# Patient Record
Sex: Male | Born: 1960 | Hispanic: No | Marital: Married | Smoking: Never smoker
Health system: Southern US, Community
[De-identification: ages and names within clinical notes are randomized; demographics above are authoritative.]

## PROBLEM LIST (undated history)

## (undated) DIAGNOSIS — M1990 Unspecified osteoarthritis, unspecified site: Secondary | ICD-10-CM

## (undated) DIAGNOSIS — K429 Umbilical hernia without obstruction or gangrene: Secondary | ICD-10-CM

## (undated) HISTORY — DX: Unspecified osteoarthritis, unspecified site: M19.90

## (undated) HISTORY — PX: HERNIA REPAIR: SHX51

---

## 1998-06-15 ENCOUNTER — Emergency Department (HOSPITAL_COMMUNITY): Admission: EM | Admit: 1998-06-15 | Discharge: 1998-06-15 | Payer: Self-pay | Admitting: Emergency Medicine

## 1998-06-17 ENCOUNTER — Emergency Department (HOSPITAL_COMMUNITY): Admission: EM | Admit: 1998-06-17 | Discharge: 1998-06-17 | Payer: Self-pay | Admitting: Internal Medicine

## 1998-07-28 ENCOUNTER — Emergency Department (HOSPITAL_COMMUNITY): Admission: EM | Admit: 1998-07-28 | Discharge: 1998-07-28 | Payer: Self-pay | Admitting: Emergency Medicine

## 1999-09-03 ENCOUNTER — Emergency Department (HOSPITAL_COMMUNITY): Admission: EM | Admit: 1999-09-03 | Discharge: 1999-09-04 | Payer: Self-pay | Admitting: Emergency Medicine

## 1999-11-17 ENCOUNTER — Encounter: Payer: Self-pay | Admitting: Emergency Medicine

## 1999-11-17 ENCOUNTER — Emergency Department (HOSPITAL_COMMUNITY): Admission: EM | Admit: 1999-11-17 | Discharge: 1999-11-17 | Payer: Self-pay | Admitting: Emergency Medicine

## 2001-05-31 ENCOUNTER — Encounter: Payer: Self-pay | Admitting: Emergency Medicine

## 2001-05-31 ENCOUNTER — Emergency Department (HOSPITAL_COMMUNITY): Admission: EM | Admit: 2001-05-31 | Discharge: 2001-05-31 | Payer: Self-pay | Admitting: Emergency Medicine

## 2004-09-19 ENCOUNTER — Emergency Department (HOSPITAL_COMMUNITY): Admission: EM | Admit: 2004-09-19 | Discharge: 2004-09-19 | Payer: Self-pay | Admitting: Emergency Medicine

## 2010-12-22 ENCOUNTER — Emergency Department (HOSPITAL_COMMUNITY): Payer: Managed Care, Other (non HMO)

## 2010-12-22 ENCOUNTER — Emergency Department (HOSPITAL_COMMUNITY)
Admission: EM | Admit: 2010-12-22 | Discharge: 2010-12-22 | Disposition: A | Discharge status: Home / Self Care | Payer: Managed Care, Other (non HMO) | Attending: Emergency Medicine | Admitting: Emergency Medicine

## 2010-12-22 DIAGNOSIS — R51 Headache: Secondary | ICD-10-CM | POA: Insufficient documentation

## 2010-12-22 DIAGNOSIS — R42 Dizziness and giddiness: Secondary | ICD-10-CM | POA: Insufficient documentation

## 2010-12-22 DIAGNOSIS — J329 Chronic sinusitis, unspecified: Secondary | ICD-10-CM | POA: Insufficient documentation

## 2010-12-22 DIAGNOSIS — R112 Nausea with vomiting, unspecified: Secondary | ICD-10-CM | POA: Insufficient documentation

## 2011-08-09 ENCOUNTER — Ambulatory Visit: Payer: Managed Care, Other (non HMO) | Admitting: Family Medicine

## 2013-03-14 ENCOUNTER — Emergency Department (HOSPITAL_COMMUNITY)
Admission: EM | Admit: 2013-03-14 | Discharge: 2013-03-14 | Disposition: A | Discharge status: Home / Self Care | Payer: Managed Care, Other (non HMO) | Attending: Emergency Medicine | Admitting: Emergency Medicine

## 2013-03-14 ENCOUNTER — Encounter (HOSPITAL_COMMUNITY): Payer: Self-pay | Admitting: Emergency Medicine

## 2013-03-14 DIAGNOSIS — M5380 Other specified dorsopathies, site unspecified: Secondary | ICD-10-CM | POA: Insufficient documentation

## 2013-03-14 DIAGNOSIS — R109 Unspecified abdominal pain: Secondary | ICD-10-CM | POA: Insufficient documentation

## 2013-03-14 DIAGNOSIS — Z791 Long term (current) use of non-steroidal anti-inflammatories (NSAID): Secondary | ICD-10-CM | POA: Insufficient documentation

## 2013-03-14 DIAGNOSIS — M545 Low back pain, unspecified: Secondary | ICD-10-CM | POA: Insufficient documentation

## 2013-03-14 DIAGNOSIS — M549 Dorsalgia, unspecified: Secondary | ICD-10-CM

## 2013-03-14 LAB — URINALYSIS, ROUTINE W REFLEX MICROSCOPIC
Bilirubin Urine: NEGATIVE
Glucose, UA: NEGATIVE mg/dL
Hgb urine dipstick: NEGATIVE
Ketones, ur: NEGATIVE mg/dL
Leukocytes, UA: NEGATIVE
Nitrite: NEGATIVE
Protein, ur: NEGATIVE mg/dL
Specific Gravity, Urine: 1.026 (ref 1.005–1.030)
Urobilinogen, UA: 0.2 mg/dL (ref 0.0–1.0)
pH: 5.5 (ref 5.0–8.0)

## 2013-03-14 MED ORDER — METHOCARBAMOL 500 MG PO TABS: 1000.0000 mg | ORAL_TABLET | Freq: Two times a day (BID) | ORAL | Status: DC | PRN

## 2013-03-14 MED ORDER — HYDROCODONE-ACETAMINOPHEN 5-325 MG PO TABS: 2.0000 | ORAL_TABLET | ORAL | Status: DC | PRN

## 2013-03-14 NOTE — ED Provider Notes (Signed)
CSN: [id number]     Arrival date & time 03/14/13  1038 History  This chart was scribed for non-physician practitioner Arthor Captain, working with Ethelda Chick, MD by Nicholos Johns, ED scribe. This patient was seen in room WTR9/WTR9 and the patient's care was started at 12:27 PM.      Chief Complaint  Patient presents with  . Back Pain   Patient is a 52 y.o. male presenting with back pain. The history is provided by the patient. No language interpreter was used.  Back Pain Location:  Lumbar spine Pain severity:  Severe Onset quality:  Gradual Duration:  3 months Progression:  Worsening Chronicity:  Recurrent Relieved by:  Being still Worsened by:  Movement Ineffective treatments:  Ibuprofen Associated symptoms: no headaches    HPI Comments: Shaun Steele is a 52 y.o. male who presents to the Emergency Department complaining of severe Left flank back pain, onset 3 months ago. Went to urgent care and was given Ibuprofen which resolved it. Pain later returned. Pain worsened with movement, relief provided by laying flat. Pt rates pain a 9/10. Pt took 2 ibuprofen last night with no relief. Pt has slight pain with urination. Pt denies any injury relating to CC. Pt denies weakness in extremities, burning with urination, hematuria, difficulty ambulating, or rashes. Denies past UTI or kidney stones.   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   Pt also reports secondary cold symptoms including cough and sore throat, with associated HA due to coughing.   History reviewed. No pertinent past medical history. History reviewed. No pertinent past surgical history. No family history on file. History  Substance Use Topics  . Smoking status: Not on file  . Smokeless tobacco: Not on file  . Alcohol Use: Not on file    Review of Systems  Genitourinary: Positive for flank pain. Negative for hematuria and difficulty urinating.  Musculoskeletal: Positive for back pain.       No  difficulty ambulating  Skin: Negative for rash.  Neurological: Negative for headaches.  All other systems reviewed and are negative.    Allergies  Review of patient's allergies indicates no known allergies.  Home Medications   Current Outpatient Rx  Name  Route  Sig  Dispense  Refill  . DM-Doxylamine-Acetaminophen (NYQUIL COLD & FLU PO)   Oral   Take 1 tablet by mouth at bedtime as needed (cold).         Marland Kitchen ibuprofen (ADVIL,MOTRIN) 600 MG tablet   Oral   Take 1 tablet by mouth daily.          Triage Vitals: BP 134/82  Pulse 69  Temp(Src) 98.1 F (36.7 C) (Oral)  Resp 16  SpO2 99% Physical Exam  Nursing note and vitals reviewed. Constitutional: He is oriented to person, place, and time. He appears well-developed and well-nourished. No distress.  HENT:  Head: Normocephalic and atraumatic.  Eyes: EOM are normal.  Neck: Neck supple.  Cardiovascular: Normal rate.   Pulmonary/Chest: Effort normal. No respiratory distress.  Musculoskeletal: Normal range of motion.  Spasm in mid thoracic's and paraspinal muscles. No cva tenderness. Full ROM. Pain worsened with twisting to the left.  Neurological: He is alert and oriented to person, place, and time.  Skin: Skin is warm and dry.  Psychiatric: He has a normal mood and affect. His behavior is normal.    ED Course  Procedures (including critical care time) DIAGNOSTIC STUDIES: Oxygen Saturation is 99% on room air, normal by  my interpretation.    COORDINATION OF CARE: At 12:31 PM:  Discussed treatment plan with patient which includes UA. Patient agrees.   Labs Review Labs Reviewed  URINALYSIS, ROUTINE W REFLEX MICROSCOPIC   Imaging Review No results found.  EKG Interpretation   None       MDM   1. Back pain    Patient with back pain.  No neurological deficits and normal neuro exam.  Patient can walk but states is painful.  No loss of bowel or bladder control.  No concern for cauda equina.  No fever, night  sweats, weight loss, h/o cancer, IVDU.  RICE protocol and pain medicine indicated and discussed with patient.  I personally performed the services described in this documentation, which was scribed in my presence. The recorded information has been reviewed and is accurate.      Arthor Captain, PA-C 03/14/13 2239

## 2013-03-14 NOTE — ED Notes (Signed)
Patient has had back pain x 3 month on and off since yesterday pain has increased. Not relieved by Ibuprofen. Patient denies recent injury.

## 2013-03-17 NOTE — ED Provider Notes (Signed)
Medical screening examination/treatment/procedure(s) were performed by non-physician practitioner and as supervising physician I was immediately available for consultation/collaboration.  EKG Interpretation   None        Arieon Corcoran K Linker, MD 03/17/13 0709 

## 2013-03-18 ENCOUNTER — Emergency Department (HOSPITAL_COMMUNITY): Payer: Managed Care, Other (non HMO)

## 2013-03-18 ENCOUNTER — Encounter (HOSPITAL_COMMUNITY): Payer: Self-pay | Admitting: Emergency Medicine

## 2013-03-18 ENCOUNTER — Emergency Department (HOSPITAL_COMMUNITY)
Admission: EM | Admit: 2013-03-18 | Discharge: 2013-03-18 | Disposition: A | Discharge status: Home / Self Care | Payer: Managed Care, Other (non HMO) | Attending: Emergency Medicine | Admitting: Emergency Medicine

## 2013-03-18 DIAGNOSIS — M545 Low back pain, unspecified: Secondary | ICD-10-CM | POA: Insufficient documentation

## 2013-03-18 DIAGNOSIS — Z791 Long term (current) use of non-steroidal anti-inflammatories (NSAID): Secondary | ICD-10-CM | POA: Insufficient documentation

## 2013-03-18 MED ORDER — DIAZEPAM 5 MG PO TABS: ORAL_TABLET | ORAL | Status: DC

## 2013-03-18 MED ORDER — OXYCODONE-ACETAMINOPHEN 5-325 MG PO TABS: 1.0000 | ORAL_TABLET | Freq: Four times a day (QID) | ORAL | Status: DC | PRN

## 2013-03-18 MED ORDER — DIAZEPAM 5 MG PO TABS
5.0000 mg | ORAL_TABLET | Freq: Once | ORAL | Status: AC
  Administered 2013-03-18: 5 mg via ORAL
  Filled 2013-03-18: qty 1

## 2013-03-18 MED ORDER — HYDROMORPHONE HCL PF 2 MG/ML IJ SOLN
2.0000 mg | Freq: Once | INTRAMUSCULAR | Status: AC
  Administered 2013-03-18: 2 mg via INTRAMUSCULAR
  Filled 2013-03-18: qty 1

## 2013-03-18 NOTE — ED Provider Notes (Signed)
CSN: [id number]     Arrival date & time 03/18/13  1145 History   First MD Initiated Contact with Patient 03/18/13 1211    This chart was scribed for Shaun Steele, a non-physician practitioner working with Ethelda Chick, MD by Lewanda Rife, ED Scribe. This patient was seen in room WTR8/WTR8 and the patient's care was started at 12:53 PM     Chief Complaint  Patient presents with  . lowere back pain    (Consider location/radiation/quality/duration/timing/severity/associated sxs/prior Treatment) The history is provided by the patient and medical records. No language interpreter was used.   HPI Comments: Shaun Steele is a 52 y.o. male who presents to the Emergency Department complaining of constant gradually worsening low back pain onset 7 days. Pt was evaluated for the same 4 days ago. Describes pain as sharp and focal. Reports prescribed hydrocodone does not alleviate symptoms. Reports symptoms are aggravated with movement and supine position. Denies associated recent injury, fall, and change in activity. Denies urinary or fecal incontinence, perineal/saddle paresthesias, fever, and PMHx of cancer. No abdominal pain, hematuria.    History reviewed. No pertinent past medical history. History reviewed. No pertinent past surgical history. No family history on file. History  Substance Use Topics  . Smoking status: Never Smoker   . Smokeless tobacco: Not on file  . Alcohol Use: No    Review of Systems  Constitutional: Negative for fever, activity change and unexpected weight change.  Gastrointestinal: Negative for abdominal pain, constipation and blood in stool.       Neg for fecal incontinence  Genitourinary: Negative for hematuria, flank pain and difficulty urinating.       Negative for urinary incontinence or retention  Musculoskeletal: Positive for back pain.  Neurological: Negative for weakness and numbness.       Negative for saddle paresthesias    Psychiatric/Behavioral: Negative for confusion.    Allergies  Review of patient's allergies indicates no known allergies.  Home Medications   Current Outpatient Rx  Name  Route  Sig  Dispense  Refill  . DM-Doxylamine-Acetaminophen (NYQUIL COLD & FLU PO)   Oral   Take 1 tablet by mouth at bedtime as needed (cold).         Marland Kitchen HYDROcodone-acetaminophen (NORCO/VICODIN) 5-325 MG per tablet   Oral   Take 2 tablets by mouth every 4 (four) hours as needed for moderate pain or severe pain.         Marland Kitchen ibuprofen (ADVIL,MOTRIN) 600 MG tablet   Oral   Take 1 tablet by mouth daily.         . methocarbamol (ROBAXIN) 500 MG tablet   Oral   Take 1,000 mg by mouth 2 (two) times daily as needed for muscle spasms.          BP 132/90  Pulse 67  Temp(Src) 97.9 F (36.6 C) (Oral)  Resp 18  SpO2 100% Physical Exam  Nursing note and vitals reviewed. Constitutional: He appears well-developed and well-nourished. No distress.  HENT:  Head: Normocephalic and atraumatic.  Eyes: Conjunctivae and EOM are normal.  Neck: Normal range of motion. Neck supple. No tracheal deviation present.  Cardiovascular: Normal rate and regular rhythm.   No murmur heard. Pulmonary/Chest: Effort normal and breath sounds normal. No respiratory distress. He has no wheezes.  Abdominal: Soft. There is no tenderness. There is no rebound, no guarding and no CVA tenderness.  No abdominal pulsatile mass.   Musculoskeletal: Normal range of motion. He exhibits no tenderness.  Back:  No midline C-spine, T-spine, or L-spine tenderness with no step-offs or deformities noted    Neurological: He is alert. He has normal reflexes. No sensory deficit. He exhibits normal muscle tone.  5/5 strength in entire lower extremities bilaterally. No sensation deficit.   Skin: Skin is warm and dry.  Psychiatric: He has a normal mood and affect. His behavior is normal.    ED Course  Procedures   COORDINATION OF  CARE:  Nursing notes reviewed. Vital signs reviewed. Initial pt interview and examination performed.   12:57 PM-Discussed work up and treatment plan with pt at bedside, which includes x-ray of lumbar spine. Pt agrees with plan.   Treatment plan initiated: Medications  HYDROmorphone (DILAUDID) injection 2 mg (2 mg Intramuscular Given 03/18/13 1313)  diazepam (VALIUM) tablet 5 mg (5 mg Oral Given 03/18/13 1312)    1:53 PM Nursing Notes Reviewed/ Care Coordinated Applicable Imaging Reviewed and incorporated into ED treatment Discussed results and treatment plan with pt. Pt demonstrates understanding and agrees with plan. Pt states symptoms have improved significantly with pain medications administered in ED.   Initial diagnostic testing ordered.    Labs Review Labs Reviewed - No data to display Imaging Review Dg Lumbar Spine Complete  03/18/2013   CLINICAL DATA:  Sharp left lower back pain for 1 week. No known injury.  EXAM: LUMBAR SPINE - COMPLETE 4+ VIEW  COMPARISON:  None.  FINDINGS: No fracture or spondylolisthesis. Mild loss of disc height at L4-L5 and at T 12-L1. Small anterior endplate osteophytes are noted at these levels most prominently at T12-L1.  The remaining lumbar disc spaces are well preserved. The soft tissues are unremarkable.  IMPRESSION: No fracture or spondylolisthesis. Mild degenerative changes as detailed.   Electronically Signed   By: Amie Portland M.D.   On: 03/18/2013 13:25    EKG Interpretation   None      Patient seen and examined. Work-up initiated. Medications ordered.   Vital signs reviewed and are as follows: Filed Vitals:   03/18/13 1156  BP: 132/90  Pulse: 67  Temp: 97.9 F (36.6 C)  Resp: 18    Pt informed of x-ray results. X-ray suggests arthritis in lower T-spine, upper L-spine.   No red flag s/s of low back pain. Patient was counseled on back pain precautions and told to do activity as tolerated but do not lift, push, or pull heavy  objects more than 10 pounds for the next week.  Patient counseled to use ice or heat on back for no longer than 15 minutes every hour.   Patient prescribed muscle relaxer and counseled on proper use of muscle relaxant medication. Told not to combine valium with previously prescribed robaxin.     Patient prescribed narcotic pain medicine and counseled on proper use of narcotic pain medications. Counseled not to combine this medication with others containing tylenol. Told not to combine Percocet with previously prescribed Vicodin.   Urged patient not to drink alcohol, drive, or perform any other activities that requires focus while taking either of these medications.  Patient urged to follow-up with PCP if pain does not improve with treatment and rest or if pain becomes recurrent. Urged to return with worsening severe pain, loss of bowel or bladder control, trouble walking.   The patient verbalizes understanding and agrees with the plan.    MDM   1. Lower back pain    Patient with back pain that is reproducible with palpation and movement. X-ray ordered as pain  is persistent and not improved with previous treatment. X-ray suggests arthritis, and it is possible patient's pain is 2/2 nerve compression. Do not suspect AAA. No neurological deficits. Patient is ambulatory. No warning symptoms of back pain including: loss of bowel or bladder control, night sweats, waking from sleep with back pain, unexplained fevers or weight loss, h/o cancer, IVDU, recent trauma. No concern for cauda equina, epidural abscess, or other serious cause of back pain. Conservative measures such as rest, ice/heat and pain medicine indicated with PCP follow-up if no improvement with conservative management.   I personally performed the services described in this documentation, which was scribed in my presence. The recorded information has been reviewed and is accurate.    Renne Crigler, PA-C 03/18/13 1416

## 2013-03-18 NOTE — ED Provider Notes (Signed)
Medical screening examination/treatment/procedure(s) were performed by non-physician practitioner and as supervising physician I was immediately available for consultation/collaboration.  EKG Interpretation   None        Ethelda Chick, MD 03/18/13 1424

## 2013-03-18 NOTE — ED Notes (Addendum)
Per pt, states left lower back pain for over a week, was here for same syptoms on 24 th-was told it was muscle related-not getting better-no dysuria

## 2013-04-05 ENCOUNTER — Ambulatory Visit: Payer: Managed Care, Other (non HMO) | Admitting: Family Medicine

## 2013-04-17 ENCOUNTER — Encounter (INDEPENDENT_AMBULATORY_CARE_PROVIDER_SITE_OTHER): Payer: Self-pay | Admitting: Surgery

## 2013-04-17 ENCOUNTER — Telehealth (INDEPENDENT_AMBULATORY_CARE_PROVIDER_SITE_OTHER): Payer: Self-pay | Admitting: Surgery

## 2013-04-17 ENCOUNTER — Ambulatory Visit (INDEPENDENT_AMBULATORY_CARE_PROVIDER_SITE_OTHER): Payer: BC Managed Care – PPO | Admitting: Surgery

## 2013-04-17 VITALS — BP 130/78 | HR 76 | Temp 98.0°F | Resp 18 | Ht 75.0 in | Wt 230.0 lb

## 2013-04-17 DIAGNOSIS — K4090 Unilateral inguinal hernia, without obstruction or gangrene, not specified as recurrent: Secondary | ICD-10-CM | POA: Insufficient documentation

## 2013-04-17 NOTE — Progress Notes (Signed)
Re:   Shaun OvensMostafa Pruett DOB:   1960/10/28 MRN:   [id number]  ASSESSMENT AND PLAN: 1.  Left inguinal hernia  I discussed the indications and complications of hernia surgery with the patient.  I discussed both the laparoscopic and open approach to hernia repair..  The potential risks of hernia surgery include, but are not limited to, bleeding, infection, open surgery, nerve injury, and recurrence of the hernia.  I provided the patient literature about hernia surgery.  I talked about being out of work for 4 weeks, unless he could do light duty.  Then his return would be about 1 to 2 weeks.  He wants to do the laparoscopic inguinal hernia repair.    2.  Left back pain  Seen in ER on 03/18/2013 3.  Asymptomatic umbilical hernia  Chief Complaint  Patient presents with  . New Evaluation    L/H   REFERRING PHYSICIAN: Lolita PatellaEADE,ROBERT ALEXANDER, MD  ([facility] [place])  HISTORY OF PRESENT ILLNESS: Shaun Steele is a 53 y.o. (DOB: 1960/10/28)  [place]  male whose primary care physician is Lolita PatellaEADE,ROBERT ALEXANDER, MD and comes to me today for left inguinal hernia.  He was lifting an airplane wheel (with two other people) Friday a week ago, 04/06/2013, at work when he felt some pain in his left groin.  [facility], where he saw Dr. Leeroy Bock. Reade who made the diagnosis of a left inguinal hernia.   The patient has had no prior inguinal or abdominal hernia.  He denies stomach disease, liver disease, or gall bladder disease.  No history of pancreas disease.  No history of colon disease. He has not had a colonoscopy.   No past medical history on file.   No past surgical history on file.    Current Outpatient Prescriptions  Medication Sig Dispense Refill  . ibuprofen (ADVIL,MOTRIN) 600 MG tablet Take 1 tablet by mouth daily.      . diazepam (VALIUM) 5 MG tablet Take 1-2 tablets every 8 hours as needed for muscle spasm  12 tablet  0  .  DM-Doxylamine-Acetaminophen (NYQUIL COLD & FLU PO) Take 1 tablet by mouth at bedtime as needed (cold).      Marland Kitchen. HYDROcodone-acetaminophen (NORCO/VICODIN) 5-325 MG per tablet Take 2 tablets by mouth every 4 (four) hours as needed for moderate pain or severe pain.      . methocarbamol (ROBAXIN) 500 MG tablet Take 1,000 mg by mouth 2 (two) times daily as needed for muscle spasms.      Marland Kitchen. oxyCODONE-acetaminophen (PERCOCET/ROXICET) 5-325 MG per tablet Take 1-2 tablets by mouth every 6 (six) hours as needed for severe pain.  15 tablet  0   No current facility-administered medications for this visit.     No Known Allergies  REVIEW OF SYSTEMS: Skin:  No history of rash.  No history of abnormal moles. Infection:  No history of hepatitis or HIV.  No history of MRSA. Neurologic:  No history of stroke.  No history of seizure.  No history of headaches. Cardiac:  No history of hypertension. No history of heart disease.   Pulmonary:  Does not smoke cigarettes.  No asthma or bronchitis.  No OSA/CPAP.  Endocrine:  No diabetes. No thyroid disease. Gastrointestinal:  See HPI. Urologic:  No history of kidney stones.  No history of bladder infections. Musculoskeletal:  Left back pain. Seen in ER on 03/18/2013 Hematologic:  No bleeding disorder.  No history of  anemia.  Not anticoagulated. Psycho-social:  The patient is oriented.   The patient has no obvious psychologic or social impairment to understanding our conversation and plan.  SOCIAL and FAMILY HISTORY: Married.   Has 3 children: 9, 10, and 11. He works at [employer] in [employer].  PHYSICAL EXAM: BP 130/78  Pulse 76  Temp(Src) 98 F (36.7 C)  Resp 18  Ht 6\' 3"  (1.905 m)  Wt 230 lb (104.327 kg)  BMI 28.75 kg/m2  General: WN middle [place] male who is alert and generally healthy appearing.  HEENT: Normal. Pupils equal. Neck: Supple. No mass.  No thyroid mass. Lymph Nodes:  No supraclavicular or cervical nodes. Lungs: Clear to auscultation and  symmetric breath sounds. Heart:  RRR. No murmur or rub. Abdomen: Soft. No mass. No tenderness.  Normal bowel sounds.  No abdominal scars.  Has asymptomatic umbilical hernia.  Has medium sized left inguinal hernia - which is almost reducible.  I did not completely reduce. Rectal: Not done. Extremities:  Good strength and ROM  in upper and lower extremities. Neurologic:  Grossly intact to motor and sensory function. Psychiatric: Has normal mood and affect. Behavior is normal.   DATA REVIEWED: Notes from [facility]  Ovidio Kin, MD,  [facility], [place] [address] [place]  [address]   [place], [place]    [address] Phone:  [phone] FAX:  [phone]

## 2013-04-17 NOTE — Telephone Encounter (Signed)
Patient met with surgery scheduling gave financial responsibilites, patient will call back to schedule

## 2013-04-18 ENCOUNTER — Encounter (INDEPENDENT_AMBULATORY_CARE_PROVIDER_SITE_OTHER): Payer: Self-pay

## 2013-04-18 ENCOUNTER — Telehealth (INDEPENDENT_AMBULATORY_CARE_PROVIDER_SITE_OTHER): Payer: Self-pay | Admitting: General Surgery

## 2013-04-18 NOTE — Telephone Encounter (Signed)
Office visit not faxed to employer attention: Peggy # [phone]

## 2013-04-18 NOTE — Telephone Encounter (Signed)
Pt called to ask for letter to be sent to his employer, excusing him from work yesterday (appt with Dr. Ezzard StandingNewman.)   Send letter to [employer], attention:  Peggy [facility] [phone].

## 2013-05-21 ENCOUNTER — Emergency Department (HOSPITAL_COMMUNITY)
Admission: EM | Admit: 2013-05-21 | Discharge: 2013-05-22 | Disposition: A | Discharge status: Home / Self Care | Payer: BC Managed Care – PPO | Attending: Emergency Medicine | Admitting: Emergency Medicine

## 2013-05-21 ENCOUNTER — Encounter (HOSPITAL_COMMUNITY): Payer: Self-pay | Admitting: Emergency Medicine

## 2013-05-21 DIAGNOSIS — K429 Umbilical hernia without obstruction or gangrene: Secondary | ICD-10-CM | POA: Insufficient documentation

## 2013-05-21 DIAGNOSIS — R1032 Left lower quadrant pain: Secondary | ICD-10-CM

## 2013-05-21 LAB — CBC WITH DIFFERENTIAL/PLATELET
BASOS PCT: 0 % (ref 0–1)
Basophils Absolute: 0 10*3/uL (ref 0.0–0.1)
EOS ABS: 0.1 10*3/uL (ref 0.0–0.7)
Eosinophils Relative: 2 % (ref 0–5)
HCT: 39.4 % (ref 39.0–52.0)
Hemoglobin: 13.5 g/dL (ref 13.0–17.0)
Lymphocytes Relative: 44 % (ref 12–46)
Lymphs Abs: 2.6 10*3/uL (ref 0.7–4.0)
MCH: 29.8 pg (ref 26.0–34.0)
MCHC: 34.3 g/dL (ref 30.0–36.0)
MCV: 87 fL (ref 78.0–100.0)
Monocytes Absolute: 0.6 10*3/uL (ref 0.1–1.0)
Monocytes Relative: 10 % (ref 3–12)
NEUTROS PCT: 44 % (ref 43–77)
Neutro Abs: 2.6 10*3/uL (ref 1.7–7.7)
Platelets: 259 10*3/uL (ref 150–400)
RBC: 4.53 MIL/uL (ref 4.22–5.81)
RDW: 12.9 % (ref 11.5–15.5)
WBC: 5.9 10*3/uL (ref 4.0–10.5)

## 2013-05-21 NOTE — ED Notes (Signed)
Per pt report: pt c/o of abd pain the LLQ.  Pt has already been evaluated for the pain and was dx with a hernia.  Pt has surgery on 3/19 but pt does not have money for surgery.  Pt reports return of pain both last night and tonight around 21:00.  Pt a/o x 4.  Skin warm and dry. NAD noted.

## 2013-05-22 DIAGNOSIS — R1032 Left lower quadrant pain: Secondary | ICD-10-CM | POA: Diagnosis present

## 2013-05-22 LAB — URINALYSIS, ROUTINE W REFLEX MICROSCOPIC
BILIRUBIN URINE: NEGATIVE
Glucose, UA: NEGATIVE mg/dL
Ketones, ur: NEGATIVE mg/dL
Leukocytes, UA: NEGATIVE
NITRITE: NEGATIVE
PH: 6 (ref 5.0–8.0)
Protein, ur: NEGATIVE mg/dL
SPECIFIC GRAVITY, URINE: 1.017 (ref 1.005–1.030)
Urobilinogen, UA: 0.2 mg/dL (ref 0.0–1.0)

## 2013-05-22 LAB — COMPREHENSIVE METABOLIC PANEL
ALBUMIN: 4 g/dL (ref 3.5–5.2)
ALT: 15 U/L (ref 0–53)
AST: 16 U/L (ref 0–37)
Alkaline Phosphatase: 72 U/L (ref 39–117)
BUN: 15 mg/dL (ref 6–23)
CO2: 29 mEq/L (ref 19–32)
Calcium: 10 mg/dL (ref 8.4–10.5)
Chloride: 99 mEq/L (ref 96–112)
Creatinine, Ser: 0.86 mg/dL (ref 0.50–1.35)
GFR calc Af Amer: >90 mL/min (ref >90)
GFR calc non Af Amer: >90 mL/min (ref >90)
Glucose, Bld: 99 mg/dL (ref 70–99)
POTASSIUM: 4.5 meq/L (ref 3.7–5.3)
Sodium: 139 mEq/L (ref 137–147)
Total Bilirubin: 0.3 mg/dL (ref 0.3–1.2)
Total Protein: 7.6 g/dL (ref 6.0–8.3)

## 2013-05-22 LAB — LIPASE, BLOOD: LIPASE: 43 U/L (ref 11–59)

## 2013-05-22 LAB — URINE MICROSCOPIC-ADD ON

## 2013-05-22 MED ORDER — OXYCODONE-ACETAMINOPHEN 5-325 MG PO TABS: 1.0000 | ORAL_TABLET | Freq: Four times a day (QID) | ORAL | Status: DC | PRN

## 2013-05-22 NOTE — Discharge Instructions (Signed)
°Emergency Department Resource Guide °1) Find a Doctor and Pay Out of Pocket °Although you won't have to find out who is covered by your insurance plan, it is a good idea to ask around and get recommendations. You will then need to call the office and see if the doctor you have chosen will accept you as a new patient and what types of options they offer for patients who are self-pay. Some doctors offer discounts or will set up payment plans for their patients who do not have insurance, but you will need to ask so you aren't surprised when you get to your appointment. ° °2) Contact Your Local Health Department °Not all health departments have doctors that can see patients for sick visits, but many do, so it is worth a call to see if yours does. If you don't know where your local health department is, you can check in your phone book. The CDC also has a tool to help you locate your state's health department, and many state websites also have listings of all of their local health departments. ° °3) Find a Walk-in Clinic °If your illness is not likely to be very severe or complicated, you may want to try a walk in clinic. These are popping up all over the country in pharmacies, drugstores, and shopping centers. They're usually staffed by nurse practitioners or physician assistants that have been trained to treat common illnesses and complaints. They're usually fairly quick and inexpensive. However, if you have serious medical issues or chronic medical problems, these are probably not your best option. ° °No Primary Care Doctor: °- Call [facility] at  832-8000 - they can help you locate a primary care doctor that  accepts your insurance, provides certain services, etc. °- Physician Referral Service- 1-800-533-3463 ° °Chronic Pain Problems: °Organization         Address  Phone   Notes  °[facility]  (336) 297-2271 Patients need to be referred by their primary care doctor.  ° °Medication  Assistance: °Organization         Address  Phone   Notes  °[place] [facility] [address] °[place], [place] [address] (336) 641-8030 --Must be a resident of [place] °-- Must have NO insurance coverage whatsoever (no Medicaid/ Medicare, etc.) °-- The pt. MUST have a primary care doctor that directs their care regularly and follows them in the community °  °[facility]  (866) 331-1348   °[employer]  (888) 892-1162   ° °Agencies that provide inexpensive medical care: °Organization         Address  Phone   Notes  °[facility]  (336) 832-8035   °[facility]    (336) 832-7272   °[facility] [address] °[place], [place] [address] (336) 832-4777   °[facility] [address], °[place] (336) 271-4999   °[facility]    (336) 373-0678   °[facility]    (336) 272-1050   °[facility] ° [address], [place] Phone:  (336) 832-4444, Fax:  (336) 832-4440 Hours of Operation:  9 am - 6 pm, M-F.  Also accepts Medicaid/Medicare and self-pay.  °[facility] ° [address], [place] Phone: (336) 832-3150, Fax: (336) 832-3151. Hours of Operation:  8:30 am - 5:30 pm, M-F.  Also accepts Medicaid and self-pay.  °[facility] [place] [address], [place] Phone: (336) 878-6027   °[facility] [address], [place], [place] (336)723-1848, Ext. 123 Mondays & Thursdays: 7-9 AM.  First 15 patients are seen on a first come, first serve basis. °  ° °Medicaid-accepting [place] Providers: ° °Organization         Address  Phone   Notes  °[facility] [address], [place] (336) 641-2100 Also accepts self-pay patients.  °[facility] [address], [place] ° (336) 856-9996   °[facility] [address], [place]  (336) 288-8857   °[facility] [address] [place] [address], [place] (336) 299-7000   °Veita Bland [address], [place]  ° (336) 373-1557 Only accepts [place] Access Medicaid patients after they have their name applied to their card.  ° °Self-Pay (no insurance) in [place]: ° °Organization         Address  Phone   Notes  °Sickle Cell Patients, [facility] [address], [place] (336) 832-1970   °[facility] [address], [place] (336) 832-4400   °[facility] ° [address], [place] (336) 992-4800   °[facility]/Dr. Osei-Bonsu ° [address] [place] [address], [place] or [address], [place] (336) 841-8500 Phone number for both [place] and [place] locations is the same.  °[facility] [address], [place] (336) 299-0000   °[facility] [address] [place] [address], [place] or [address] (336) 852-7530 °(336) 878-2260   °[facility] [address], [place] (336) 350-1642, phone; (336) 294-5005, fax Sees patients 1st and 3rd Saturday of every month.  Must not qualify for public or private insurance (i.e. Medicaid, Medicare, [place] Health Choice, Veterans' Benefits) • Household income should be no more than 200% of the poverty level •The clinic cannot treat you if you are pregnant or think you are pregnant • Sexually transmitted diseases are not treated at the clinic.  ° ° °Dental Care: °Organization         Address  Phone  Notes  °[place] [facility] [address], [place] (336) 641-6152 Accepts children up to age 21 who are enrolled in Medicaid or [place] Health Choice; pregnant women with a Medicaid card; and children who have applied for Medicaid or [place] Health Choice, but were declined, whose parents can pay a reduced fee at time of service.  °[place]  [facility] [place]  [address], [place] (336) 641-7733 Accepts children up to age 21 who are enrolled in Medicaid or  Health Choice; pregnant women with a Medicaid card; and children who have applied for Medicaid or  Health Choice, but were declined, whose parents can pay a reduced fee at time of service.  °[facility] ° [address],  (336) 641-4533 Patients are seen by appointment only. Walk-ins are not accepted. [facility] will see patients 18 years of age and older. °Monday - Tuesday (8am-5pm) °Most Wednesdays (8:30-5pm) °$30 per visit, cash only  °[facility] ° [address], [place] (336) 641-4533 Patients are seen by appointment only. Walk-ins are not accepted. [facility] will see patients 18 years of age and older. °One   Wednesday Evening (Monthly: Volunteer Based).  $30 per visit, cash only  °[facility]  (919) 537-3737 for adults; Children under age 4, call Graduate Pediatric Dentistry at ([phone]) [phone]. Children aged 4-14, please call (919) [phone] to request a pediatric application. ° Dental services are provided in all areas of dental care including fillings, crowns and bridges, complete and partial dentures, implants, gum treatment, root canals, and extractions. Preventive care is also provided. Treatment is provided to both adults and children. °Patients are selected via a lottery and there is often a waiting list. °  °[facility] [address], °[place] ° (336) 763-8833 [email or web address] °  °[facility] [address], [place], [place] (336)723-1848, Ext. 123 Second and Fourth Thursday of each month, opens at 6:30 AM; Clinic ends at 9 AM.  Patients are seen on a first-come first-served basis, and a limited number are seen during each clinic.  ° °[facility] ° [address], [place], [place] (336) 723-7904    Eligibility Requirements °You must have lived in [place], [place], or [place] counties for at least the last three months. °  You cannot be eligible for state or federal sponsored healthcare insurance, including Veterans Administration, Medicaid, or Medicare. °  You generally cannot be eligible for healthcare insurance through your employer.  °  How to apply: °Eligibility screenings are held every Tuesday and Wednesday afternoon from 1:00 pm until 4:00 pm. You do not need an appointment for the interview!  °[facility] [address], [place], [place] 336-631-2330   °[place] [facility]  336-342-8273   °[facility]  336-703-3100   °[facility]  336-570-6415   ° °Behavioral Health Resources in the Community: °Intensive Outpatient Programs °Organization         Address  Phone  Notes  °[facility] [address], [place], [place] 336-878-6098   °[facility] [address], [place], [place] 336-832-9800   °[facility] [address], [place], [place] ° 336-882-2125   °[facility] [address],  °[place], [place] 1-800-853-5163 or 336-641-4981   °Substance Abuse Resources °Organization         Address  Phone  Notes  °[facility]  336-882-2125   °[facility]  336-784-9470   °[facility]  336-285-9073   °[facility]  336-845-3988   °Residential & Outpatient Substance Abuse Program  1-800-659-3381   °Psychological Services °Organization         Address  Phone  Notes  °[facility]  336- 832-9600   °[facility]  336- 378-7881   °[facility] [address], [place] 1-800-853-5163 or 336-641-4981   ° °Mobile Crisis Teams °Organization         Address  Phone  Notes  °[facility], Mobile Crisis Care Unit  1-877-626-1772   °Assertive °[facility] ° [address]  [place], [place] 336-834-9664   °Sharon DeEsch [address] °[place] 336-554-5454   ° °Self-Help/Support Groups °Organization         Address  Phone             Notes  °[facility] - variety of support groups  336- 373-1402 Call for more information  °Narcotics Anonymous (NA), [facility] [address], °[place]  2 meetings at this location  ° °  Residential Treatment Programs °Organization         Address  Phone  Notes  °[facility] [address],    °[place]  1-866-801-8205   °[facility] ° [address], [place], [place] 704-293-8524   °[facility] [address], [place] 336-845-3988 Admissions: 8am-3pm M-F  °[facility] [address]    °[place], [place] 336-841-1104   °[facility] [address], [place], [place] 336-379-7146   °[facility] [address]  °[place], [place] 336-285-9073   °[facility] - Intensive Outpatient [address], [place], [place] 336-852-3033   °[facility] [address]  °[place], [place] 1-877-615-2722 or 336-784-9470   °[facility] [address] [place], [place] 336-227-7417 Accepts Medicaid  °[facility] [address]  ° [place] 1-800-659-3381 Substance Abuse/Addiction Treatment  ° °[place] Behavioral Health Resources °Organization         Address  Phone  Notes  °[facility]  (888) 581-9988   °Julie Brannon, PhD [address] [place], [place]   (336) 349-5553 or (336) 951-0000   °[facility]   [address] °[place], [place] (336) 349-4454   °[facility] [address], [place], [place] (336) 342-8316 Insurance/Medicaid/sponsorship through [facility]  °[facility] [address]                                    [place], [place] (336) 342-8316 Therapy/tele-psych/case    °[facility] [address]  ° [place], [place] (336) 349-2233    °Dr. Arfeen  (336) 349-4544   °[facility] [place]  [employer] [place] [facility] 1) [address], [place] °2) [address], [place] °3)  [address], [place] (336) 349-3220 °(336) 342-7768 ° °(336) 342-8140   °[place] [facility] (336) 342-1394 or (336) 342-3537 (After Hours)    ° ° °

## 2013-05-22 NOTE — ED Notes (Signed)
Patient states he is having L groin pain. Patient states he has been seen by a surgeon and was advised he needs surgery. Patient states he is unable to afford the upfront fee required to schedule the operation.

## 2013-05-22 NOTE — ED Provider Notes (Signed)
CSN: [id number]     Arrival date & time 05/21/13  2233 History   First MD Initiated Contact with Patient 05/22/13 [id number]     Chief Complaint  Patient presents with  . Groin Pain    hx of hernia that needs surgical removal, patient states he can not afford the surgery     (Consider location/radiation/quality/duration/timing/severity/associated sxs/prior Treatment) Patient is a 53 y.o. male presenting with groin pain. The history is provided by the patient.  Groin Pain This is a recurrent problem. The current episode started 3 to 5 hours ago. Episode frequency: intermittent. The problem has been resolved. Pertinent negatives include no chest pain, no abdominal pain, no headaches and no shortness of breath. Exacerbated by: valsalva. Relieved by: spontaneously. He has tried nothing for the symptoms. The treatment provided significant relief.    History reviewed. No pertinent past medical history. History reviewed. No pertinent past surgical history. No family history on file. History  Substance Use Topics  . Smoking status: Never Smoker   . Smokeless tobacco: Not on file  . Alcohol Use: No    Review of Systems  Constitutional: Negative for fever.  HENT: Negative for drooling and rhinorrhea.   Eyes: Negative for pain.  Respiratory: Negative for cough and shortness of breath.   Cardiovascular: Negative for chest pain and leg swelling.  Gastrointestinal: Negative for nausea, vomiting, abdominal pain and diarrhea.  Genitourinary: Negative for dysuria and hematuria.  Musculoskeletal: Negative for gait problem and neck pain.  Skin: Negative for color change.  Neurological: Negative for numbness and headaches.  Hematological: Negative for adenopathy.  Psychiatric/Behavioral: Negative for behavioral problems.  All other systems reviewed and are negative.      Allergies  Review of patient's allergies indicates no known allergies.  Home Medications   Current Outpatient Rx  Name   Route  Sig  Dispense  Refill  . oxyCODONE-acetaminophen (PERCOCET) 5-325 MG per tablet   Oral   Take 1 tablet by mouth every 6 (six) hours as needed for moderate pain.   15 tablet   0    BP 117/80  Pulse 68  Temp(Src) 97.8 F (36.6 C) (Oral)  Resp 14  Ht 6\' 2"  (1.88 m)  Wt 220 lb (99.791 kg)  BMI 28.23 kg/m2  SpO2 97% Physical Exam  Nursing note and vitals reviewed. Constitutional: He is oriented to person, place, and time. He appears well-developed and well-nourished.  HENT:  Head: Normocephalic and atraumatic.  Right Ear: External ear normal.  Left Ear: External ear normal.  Nose: Nose normal.  Mouth/Throat: Oropharynx is clear and moist. No oropharyngeal exudate.  Eyes: Conjunctivae and EOM are normal. Pupils are equal, round, and reactive to light.  Neck: Normal range of motion. Neck supple.  Cardiovascular: Normal rate, regular rhythm, normal heart sounds and intact distal pulses.  Exam reveals no gallop and no friction rub.   No murmur heard. Pulmonary/Chest: Effort normal and breath sounds normal. No respiratory distress. He has no wheezes.  Abdominal: Soft. Bowel sounds are normal. He exhibits no distension. There is no tenderness. There is no rebound and no guarding.  Small umbilical hernia.   Genitourinary:  Soft left sided inguinal hernia which is not ttp. Not reducible by me.   Musculoskeletal: Normal range of motion. He exhibits no edema and no tenderness.  Neurological: He is alert and oriented to person, place, and time.  Skin: Skin is warm and dry.  Psychiatric: He has a normal mood and affect. His behavior is normal.  ED Course  Procedures (including critical care time) Labs Review Labs Reviewed  URINALYSIS, ROUTINE W REFLEX MICROSCOPIC - Abnormal; Notable for the following:    Hgb urine dipstick TRACE (*)    All other components within normal limits  CBC WITH DIFFERENTIAL  COMPREHENSIVE METABOLIC PANEL  LIPASE, BLOOD  URINE MICROSCOPIC-ADD ON    Imaging Review No results found.   EKG Interpretation None      MDM   Final diagnoses:  Left groin pain    2:37 AM 53 y.o. male who presents with left groin pain which began at 9 PM this evening while he was working. He states that he saw a surgeon 1 month ago who diagnosed him with a left inguinal hernia. He was having similar pain at that time. He notes that he is currently asymptomatic and no longer having the pain. He states that he believes the pain is consistent with the hernia pain he has had previously. He is afebrile and vital signs are unremarkable here. Lab work and urinalysis sent prior to my evaluation. They're noncontributory. Normal BM's at home. Will provide Percocet for pain at home and recommend he followup with his surgeon as needed. No evidence of strangulated hernia on my exam.   2:46 AM:  I have discussed the diagnosis/risks/treatment options with the patient and believe the pt to be eligible for discharge home to follow-up with his surgeon. We also discussed returning to the ED immediately if new or worsening sx occur. We discussed the sx which are most concerning (e.g., worsening pain, fever) that necessitate immediate return. Medications administered to the patient during their visit and any new prescriptions provided to the patient are listed below.  Medications given during this visit Medications - No data to display  Discharge Medication List as of 05/22/2013  2:36 AM    START taking these medications   Details  oxyCODONE-acetaminophen (PERCOCET) 5-325 MG per tablet Take 1 tablet by mouth every 6 (six) hours as needed for moderate pain., Starting 05/22/2013, Until Discontinued, Print          Junius Argyle, MD 05/22/13 1950

## 2013-06-01 ENCOUNTER — Encounter (INDEPENDENT_AMBULATORY_CARE_PROVIDER_SITE_OTHER): Payer: Self-pay

## 2013-06-15 ENCOUNTER — Ambulatory Visit (HOSPITAL_COMMUNITY): Admission: RE | Admit: 2013-06-15 | Payer: BC Managed Care – PPO | Source: Ambulatory Visit | Admitting: Surgery

## 2013-06-15 ENCOUNTER — Encounter (HOSPITAL_COMMUNITY): Admission: RE | Payer: Self-pay | Source: Ambulatory Visit

## 2013-06-15 ENCOUNTER — Other Ambulatory Visit (INDEPENDENT_AMBULATORY_CARE_PROVIDER_SITE_OTHER): Payer: Self-pay

## 2013-06-15 DIAGNOSIS — K4090 Unilateral inguinal hernia, without obstruction or gangrene, not specified as recurrent: Secondary | ICD-10-CM

## 2013-06-15 SURGERY — REPAIR, HERNIA, INGUINAL, LAPAROSCOPIC
Anesthesia: General

## 2013-06-15 MED ORDER — HYDROCODONE-ACETAMINOPHEN 5-325 MG PO TABS: 1.0000 | ORAL_TABLET | Freq: Four times a day (QID) | ORAL | Status: DC | PRN

## 2013-06-19 ENCOUNTER — Telehealth (INDEPENDENT_AMBULATORY_CARE_PROVIDER_SITE_OTHER): Payer: Self-pay | Admitting: General Surgery

## 2013-06-19 NOTE — Telephone Encounter (Signed)
Patient called in explaining that he is still having pain at his left groin.  He wanted to know if this is normal.  I educated the patient on how pain and swelling are expected with a left inguinal hernia.  Asked him what he has been doing to subside the pain.  He informed me [facility] is still [facility] and alternating with ibuprofen.  I also informed the patient that he could use heat and ice packs.  Made sure to clarify that they should not be put directly on the skin or incision.  Patient verbalized understanding and agree to try this as well.

## 2013-06-20 ENCOUNTER — Telehealth (INDEPENDENT_AMBULATORY_CARE_PROVIDER_SITE_OTHER): Payer: Self-pay | Admitting: General Surgery

## 2013-06-20 ENCOUNTER — Telehealth (INDEPENDENT_AMBULATORY_CARE_PROVIDER_SITE_OTHER): Payer: Self-pay

## 2013-06-20 NOTE — Telephone Encounter (Signed)
Pt called stating he is still very sore after surgery. States some soreness in scrotum with minimal swelling. Scrotum Warm to touch but not feverish. Pt wants to know how long he will be sore after surgery. Pt advised that soreness is to be expected for several weeks after hernia repair. Pt advised discomfort should slowly improve over the next few days. Pt to call if pain med not managing pain or with other concerns.

## 2013-06-20 NOTE — Telephone Encounter (Signed)
Pt called to report pain below the area of his [facility].  Recommended ice pack to site and begin to take ibuprofen (600 mg Q6H or 800 mg Q8H) around the clock for 48 hours.  This will help both with swelling and pain.  He will call back tomorrow if not improved.

## 2013-07-02 ENCOUNTER — Encounter (INDEPENDENT_AMBULATORY_CARE_PROVIDER_SITE_OTHER): Payer: Self-pay

## 2013-07-07 ENCOUNTER — Telehealth (INDEPENDENT_AMBULATORY_CARE_PROVIDER_SITE_OTHER): Payer: Self-pay | Admitting: General Surgery

## 2013-07-07 NOTE — Telephone Encounter (Signed)
Delayed entry.   Discussed with pt at 1:45 pm  Pt complained of pain in his "male parts."  S/p LIH repair at the surgical center 3/27 by Dr. Ezzard StandingNewman  It was difficult to ascertain exactly what the nature of the discomfort was because the patient seemed to be reluctant to be specific with me.    He denied fevers/nausea/vomiting/swelling in surgical site. It seemed that he was having burning in his penis when he urinated, but i could not get a straight answer regarding his problem.    He may need U/A if he truly has dysuria vs testicular pain, which i would not be surprised about after hernia repair.  He is out of narcotics and is taking ibuprofen.  i advised him to also use ice packs to groin and call back or come to the ER if the burning when he urinates gets worse.

## 2013-07-08 ENCOUNTER — Emergency Department (HOSPITAL_COMMUNITY)
Admission: EM | Admit: 2013-07-08 | Discharge: 2013-07-08 | Disposition: A | Discharge status: Home / Self Care | Payer: BC Managed Care – PPO | Attending: Emergency Medicine | Admitting: Emergency Medicine

## 2013-07-08 ENCOUNTER — Encounter (HOSPITAL_COMMUNITY): Payer: Self-pay | Admitting: Emergency Medicine

## 2013-07-08 DIAGNOSIS — Y838 Other surgical procedures as the cause of abnormal reaction of the patient, or of later complication, without mention of misadventure at the time of the procedure: Secondary | ICD-10-CM | POA: Insufficient documentation

## 2013-07-08 DIAGNOSIS — R109 Unspecified abdominal pain: Secondary | ICD-10-CM | POA: Insufficient documentation

## 2013-07-08 DIAGNOSIS — Z79899 Other long term (current) drug therapy: Secondary | ICD-10-CM | POA: Insufficient documentation

## 2013-07-08 DIAGNOSIS — R102 Pelvic and perineal pain: Secondary | ICD-10-CM

## 2013-07-08 HISTORY — DX: Umbilical hernia without obstruction or gangrene: K42.9

## 2013-07-08 LAB — URINALYSIS, ROUTINE W REFLEX MICROSCOPIC
Bilirubin Urine: NEGATIVE
GLUCOSE, UA: NEGATIVE mg/dL
HGB URINE DIPSTICK: NEGATIVE
Ketones, ur: NEGATIVE mg/dL
Leukocytes, UA: NEGATIVE
Nitrite: NEGATIVE
PH: 6 (ref 5.0–8.0)
Protein, ur: NEGATIVE mg/dL
SPECIFIC GRAVITY, URINE: 1.01 (ref 1.005–1.030)
Urobilinogen, UA: 0.2 mg/dL (ref 0.0–1.0)

## 2013-07-08 MED ORDER — OXYCODONE-ACETAMINOPHEN 5-325 MG PO TABS: 1.0000 | ORAL_TABLET | Freq: Four times a day (QID) | ORAL | Status: DC | PRN

## 2013-07-08 MED ORDER — HYDROMORPHONE HCL PF 1 MG/ML IJ SOLN
1.0000 mg | Freq: Once | INTRAMUSCULAR | Status: AC
  Administered 2013-07-08: 1 mg via INTRAMUSCULAR
  Filled 2013-07-08: qty 1

## 2013-07-08 NOTE — ED Notes (Signed)
MD at bedside. 

## 2013-07-08 NOTE — ED Provider Notes (Signed)
CSN: [id number]     Arrival date & time 07/08/13  [id number] History   First MD Initiated Contact with Patient 07/08/13 0940     Chief Complaint  Patient presents with  . Dysuria  . Post-op Problem     (Consider location/radiation/quality/duration/timing/severity/associated sxs/prior Treatment) Patient is a 53 y.o. male presenting with dysuria. The history is provided by the patient.  Dysuria This is a new problem. The current episode started more than 2 days ago. Episode frequency: intermittently after urination. The problem has not changed since onset.Pertinent negatives include no chest pain, no abdominal pain, no headaches and no shortness of breath. Exacerbated by: urinating. Nothing relieves the symptoms. Treatments tried: hydrocodone. The treatment provided mild relief.    Past Medical History  Diagnosis Date  . Umbilical hernia    Past Surgical History  Procedure Laterality Date  . Hernia repair     History reviewed. No pertinent family history. History  Substance Use Topics  . Smoking status: Never Smoker   . Smokeless tobacco: Not on file  . Alcohol Use: No    Review of Systems  Constitutional: Negative for fever.  HENT: Negative for drooling and rhinorrhea.   Eyes: Negative for pain.  Respiratory: Negative for cough and shortness of breath.   Cardiovascular: Negative for chest pain and leg swelling.  Gastrointestinal: Negative for nausea, vomiting, abdominal pain and diarrhea.  Genitourinary: Negative for dysuria and hematuria.  Musculoskeletal: Negative for gait problem and neck pain.  Skin: Negative for color change.  Neurological: Negative for numbness and headaches.  Hematological: Negative for adenopathy.  Psychiatric/Behavioral: Negative for behavioral problems.  All other systems reviewed and are negative.     Allergies  Review of patient's allergies indicates no known allergies.  Home Medications   Prior to Admission medications   Medication Sig  Start Date End Date Taking? Authorizing Provider  HYDROcodone-acetaminophen (NORCO) 5-325 MG per tablet Take 1 tablet by mouth every 6 (six) hours as needed for moderate pain. 06/15/13  Yes Kandis Cockingavid H Newman, MD   BP 146/81  Pulse 71  Temp(Src) 98.9 F (37.2 C) (Oral)  Resp 18  SpO2 100% Physical Exam  Nursing note and vitals reviewed. Constitutional: He is oriented to person, place, and time. He appears well-developed and well-nourished.  HENT:  Head: Normocephalic and atraumatic.  Right Ear: External ear normal.  Left Ear: External ear normal.  Nose: Nose normal.  Mouth/Throat: Oropharynx is clear and moist. No oropharyngeal exudate.  Eyes: Conjunctivae and EOM are normal. Pupils are equal, round, and reactive to light.  Neck: Normal range of motion. Neck supple.  Cardiovascular: Normal rate, regular rhythm, normal heart sounds and intact distal pulses.  Exam reveals no gallop and no friction rub.   No murmur heard. Pulmonary/Chest: Effort normal and breath sounds normal. No respiratory distress. He has no wheezes.  Abdominal: Soft. Bowel sounds are normal. He exhibits no distension. There is no tenderness. There is no rebound and no guarding.  Postoperative abdominal surgical sites appear clean dry and intact.  Genitourinary: Penis normal. No penile tenderness.  Normal-appearing penis and testicles.  Normal cremasteric reflex bilaterally.  Mild tenderness to palpation of bilateral testicles.  No evidence of inguinal hernia.  Musculoskeletal: Normal range of motion. He exhibits no edema and no tenderness.  Neurological: He is alert and oriented to person, place, and time.  Skin: Skin is warm and dry.  Psychiatric: He has a normal mood and affect. His behavior is normal.    ED Course  Procedures (including critical care time) Labs Review Labs Reviewed  URINE CULTURE  URINALYSIS, ROUTINE W REFLEX MICROSCOPIC    Imaging Review No results found.   EKG  Interpretation None      MDM   Final diagnoses:  Suprapubic pain    10:00 AM 53 y.o. male S/p LIH repair at the surgical center 3/27 by Dr. Ezzard StandingNewman who presents with suprapubic sharp pains occurring after urination which began 2 days ago. He denies any history of STDs or penile discharge. He is currently sexually active with his wife of 13 years. He denies any fevers, vomiting, or diarrhea. He is afebrile with unremarkable vital signs here. He appears well on exam. He has mild tenderness to palpation of the suprapubic area. He denies any testicular pain although he notes that his testicles have been more sensitive since the surgery. He has a normal GU exam without evidence of hernia. Will get screening urinalysis. The patient would like something stronger for pain, will give IM Dilaudid.  11:22 AM: UA non-contrib. Pt feeling better after pain meds. Having normal bm's. Abd is soft and benign. Unknown cause of his pain. Do not think this is a torsion, UTI, or STD.  I offered imaging and labs, but think it would be ok to have him f/u w/ his surgeon tomorrow in clinic as he otherwise appears well. He agrees. Will provide pain med refill.  I have discussed the diagnosis/risks/treatment options with the patient and believe the pt to be eligible for discharge home to follow-up with [facility] tomorrow. We also discussed returning to the ED immediately if new or worsening sx occur. We discussed the sx which are most concerning (e.g., worsening pain, fever, emesis) that necessitate immediate return. Medications administered to the patient during their visit and any new prescriptions provided to the patient are listed below.  Medications given during this visit Medications  HYDROmorphone (DILAUDID) injection 1 mg (1 mg Intramuscular Given 07/08/13 1009)    New Prescriptions   OXYCODONE-ACETAMINOPHEN (PERCOCET) 5-325 MG PER TABLET    Take 1-2 tablets by mouth every 6 (six) hours as needed for moderate pain.      Junius ArgyleForrest S Lamiah Marmol, MD 07/08/13 1124

## 2013-07-08 NOTE — ED Notes (Signed)
Pt had hernia surgery on the 27th.  Since Thursday has been experiencing low abd/groin pain when he urinates.  Denies NVD.

## 2013-07-08 NOTE — Discharge Instructions (Signed)
Abdominal Pain, Adult °Many things can cause abdominal pain. Usually, abdominal pain is not caused by a disease and will improve without treatment. It can often be observed and treated at home. Your health care provider will do a physical exam and possibly order blood tests and X-rays to help determine the seriousness of your pain. However, in many cases, more time must pass before a clear cause of the pain can be found. Before that point, your health care provider may not know if you need more testing or further treatment. °HOME CARE INSTRUCTIONS  °Monitor your abdominal pain for any changes. The following actions may help to alleviate any discomfort you are experiencing: °· Only take over-the-counter or prescription medicines as directed by your health care provider. °· Do not take laxatives unless directed to do so by your health care provider. °· Try a clear liquid diet (broth, tea, or water) as directed by your health care provider. Slowly move to a bland diet as tolerated. °SEEK MEDICAL CARE IF: °· You have unexplained abdominal pain. °· You have abdominal pain associated with nausea or diarrhea. °· You have pain when you urinate or have a bowel movement. °· You experience abdominal pain that wakes you in the night. °· You have abdominal pain that is worsened or improved by eating food. °· You have abdominal pain that is worsened with eating fatty foods. °SEEK IMMEDIATE MEDICAL CARE IF:  °· Your pain does not go away within 2 hours. °· You have a fever. °· You keep throwing up (vomiting). °· Your pain is felt only in portions of the abdomen, such as the right side or the left lower portion of the abdomen. °· You pass bloody or black tarry stools. °MAKE SURE YOU: °· Understand these instructions.   °· Will watch your condition.   °· Will get help right away if you are not doing well or get worse.   °Document Released: 12/16/2004 Document Revised: 12/27/2012 Document Reviewed: 11/15/2012 °[employer]® Patient  Information ©2014 [employer]. ° °

## 2013-07-09 LAB — URINE CULTURE
Colony Count: NO GROWTH
Culture: NO GROWTH

## 2013-07-12 ENCOUNTER — Encounter (INDEPENDENT_AMBULATORY_CARE_PROVIDER_SITE_OTHER): Payer: BC Managed Care – PPO | Admitting: Surgery

## 2013-07-13 ENCOUNTER — Encounter (INDEPENDENT_AMBULATORY_CARE_PROVIDER_SITE_OTHER): Payer: Self-pay | Admitting: General Surgery

## 2013-07-13 ENCOUNTER — Telehealth (INDEPENDENT_AMBULATORY_CARE_PROVIDER_SITE_OTHER): Payer: Self-pay

## 2013-07-13 ENCOUNTER — Encounter (INDEPENDENT_AMBULATORY_CARE_PROVIDER_SITE_OTHER): Payer: BC Managed Care – PPO | Admitting: Surgery

## 2013-07-13 ENCOUNTER — Encounter (INDEPENDENT_AMBULATORY_CARE_PROVIDER_SITE_OTHER): Payer: Self-pay

## 2013-07-13 ENCOUNTER — Ambulatory Visit (INDEPENDENT_AMBULATORY_CARE_PROVIDER_SITE_OTHER): Payer: BC Managed Care – PPO | Admitting: General Surgery

## 2013-07-13 VITALS — BP 144/88 | HR 80 | Temp 98.0°F | Resp 18 | Wt 235.8 lb

## 2013-07-13 DIAGNOSIS — Z09 Encounter for follow-up examination after completed treatment for conditions other than malignant neoplasm: Secondary | ICD-10-CM

## 2013-07-13 MED ORDER — HYDROCODONE-ACETAMINOPHEN 5-325 MG PO TABS: 1.0000 | ORAL_TABLET | Freq: Four times a day (QID) | ORAL | Status: DC | PRN

## 2013-07-13 NOTE — Progress Notes (Signed)
Chief complaint: Followup inguinal hernia  History: Patient returns to the office today 2 weeks following laparoscopic repair of his left inguinal hernia. He states he's been having quite a bit of sharp pain which he describes in the suprapubic area bilaterally. He went to the emergency room the other day and was given a prescription for hydrocodone. He says the pain is about the same. It bothers him quite a lot during the day and it is better at night after he takes his medication. He has no other unusual symptoms such as fever or nausea or urinary difficulties.  Exam: BP 144/88  Pulse 80  Temp(Src) 98 F (36.7 C) (Temporal)  Resp 18  Wt 235 lb 12.8 oz (106.958 kg) General: No acute distress Abdomen: There is what feels to be a small seroma underneath his umbilicus. No evidence of infection. There is mild tenderness suprapubically but no swelling or mass or evidence of early recurrence.  Assessment and plan: Status post laparoscopic inguinal hernia repair. Somewhat more discomfort than typical but I do not see any evidence of complication at this point. I told them this should steadily improved and if not he should call his any point otherwise he will return to see Dr. Ezzard StandingNewman in 3 weeks.

## 2013-07-13 NOTE — Telephone Encounter (Signed)
LATE Entry (spoke to patient at 1 pm )  Called patient to inform him DR. Ezzard Standingewman  Is going to be delayed in the OR today, ask if he could wait to be seen or if he rather be rescheduled , He stated he was in the ED last weekend and he needed to be seen today. I explained if he could wait until Dr. Ezzard StandingNewman arrived he could be the first patient to be seen. Patient stated he was in the parking alot  And had [place] work [place] 3[place]00 [place]

## 2013-07-18 ENCOUNTER — Encounter (INDEPENDENT_AMBULATORY_CARE_PROVIDER_SITE_OTHER): Payer: BC Managed Care – PPO | Admitting: Surgery

## 2013-08-01 ENCOUNTER — Encounter (INDEPENDENT_AMBULATORY_CARE_PROVIDER_SITE_OTHER): Payer: BC Managed Care – PPO | Admitting: Surgery

## 2013-08-22 ENCOUNTER — Ambulatory Visit (INDEPENDENT_AMBULATORY_CARE_PROVIDER_SITE_OTHER): Payer: BC Managed Care – PPO | Admitting: Family Medicine

## 2013-08-22 ENCOUNTER — Ambulatory Visit: Payer: BC Managed Care – PPO

## 2013-08-22 VITALS — BP 126/92 | HR 83 | Temp 98.3°F | Ht 71.5 in | Wt 235.0 lb

## 2013-08-22 DIAGNOSIS — M25569 Pain in unspecified knee: Secondary | ICD-10-CM

## 2013-08-22 DIAGNOSIS — M259 Joint disorder, unspecified: Secondary | ICD-10-CM

## 2013-08-22 DIAGNOSIS — M1711 Unilateral primary osteoarthritis, right knee: Secondary | ICD-10-CM

## 2013-08-22 DIAGNOSIS — IMO0002 Reserved for concepts with insufficient information to code with codable children: Secondary | ICD-10-CM

## 2013-08-22 DIAGNOSIS — M1710 Unilateral primary osteoarthritis, unspecified knee: Secondary | ICD-10-CM

## 2013-08-22 MED ORDER — MELOXICAM 15 MG PO TABS: 15.0000 mg | ORAL_TABLET | Freq: Every day | ORAL | Status: DC

## 2013-08-22 NOTE — Patient Instructions (Signed)
Thank you for coming in today  You have arthritis in your knee Stop ibuprofen Start meloxicam daily with breakfast Knee injection today Return as needed  Wear and Tear Disorders of the Knee (Arthritis, Osteoarthritis) Everyone will experience wear and tear injuries (arthritis, osteoarthritis) of the knee. These are the changes we all get as we age. They come from the joint stress of daily living. The amount of cartilage damage in your knee and your symptoms determine if you need surgery. Mild problems require approximately two months recovery time. More severe problems take several months to recover. With mild problems, your surgeon may find worn and rough cartilage surfaces. With severe changes, your surgeon may find cartilage that has completely worn away and exposed the bone. Loose bodies of bone and cartilage, bone spurs (excess bone growth), and injuries to the menisci (cushions between the large bones of your leg) are also common. All of these problems can cause pain. For a mild wear and tear problem, rough cartilage may simply need to be shaved and smoothed. For more severe problems with areas of exposed bone, your surgeon may use an instrument for roughing up the bone surfaces to stimulate new cartilage growth. Loose bodies are usually removed. Torn menisci may be trimmed or repaired. ABOUT THE ARTHROSCOPIC PROCEDURE Arthroscopy is a surgical technique. It allows your orthopedic surgeon to diagnose and treat your knee injury with accuracy. The surgeon looks into your knee through a small scope. The scope is like a small (pencil-sized) telescope. Arthroscopy is less invasive than open knee surgery. You can expect a more rapid recovery. After the procedure, you will be moved to a recovery area until most of the effects of the medication have worn off. Your caregiver will discuss the test results with you. RECOVERY The severity of the arthritis and the type of procedure performed will determine  recovery time. Other important factors include age, physical condition, medical conditions, and the type of rehabilitation program. Strengthening your muscles after arthroscopy helps guarantee a better recovery. Follow your caregiver's instructions. Use crutches, rest, elevate, ice, and do knee exercises as instructed. Your caregivers will help you and instruct you with exercises and other physical therapy required to regain your mobility, muscle strength, and functioning following surgery. Only take over-the-counter or prescription medicines for pain, discomfort, or fever as directed by your caregiver.  SEEK MEDICAL CARE IF:   There is increased bleeding (more than a small spot) from the wound.  You notice redness, swelling, or increasing pain in the wound.  Pus is coming from wound.  You develop an unexplained oral temperature above 102 F (38.9 C) , or as your caregiver suggests.  You notice a foul smell coming from the wound or dressing.  You have severe pain with motion of the knee. SEEK IMMEDIATE MEDICAL CARE IF:   You develop a rash.  You have difficulty breathing.  You have any allergic problems. MAKE SURE YOU:   Understand these instructions.  Will watch your condition.  Will get help right away if you are not doing well or get worse. Document Released: 03/05/2000 Document Revised: 05/31/2011 Document Reviewed: 08/02/2007 [facility] Patient Information 2014 [place], [place].

## 2013-08-22 NOTE — Progress Notes (Signed)
   Subjective:    Patient ID: Shaun Steele, male    DOB: 1960-10-06, 53 y.o.   MRN: [id number]  HPI Right knee pain and popping. Pain x 5 weeks now. Was seen at another urgent care about 3 weeks ago and was given ibuprofen but this did not help. No injury, has gradually come on. Worsening. Unsure of swelling. No locking or catching. No giving out. No history of knee problems or surgery. Not had xrays.  Review of Systems  All other systems reviewed and are negative.      Objective:   Physical Exam  Constitutional: He is oriented to person, place, and time. He appears well-developed and well-nourished. No distress.  HENT:  Head: Normocephalic and atraumatic.  Eyes: Conjunctivae are normal. No scleral icterus.  Cardiovascular: Normal rate.   Pulmonary/Chest: Effort normal.  Neurological: He is alert and oriented to person, place, and time.  Skin: Skin is warm and dry.  Psychiatric: He has a normal mood and affect. His behavior is normal.  Right knee: FROM. 1-2+ effusion. No jt line TTP. Neg varus, valgus, lachmans. Pain and pop with mcmurrays  [facility] reading (PRIMARY) by  Dr. Neomia Dear. Severe medial compartment DJD right knee.    Assessment & Plan:  #1. Right Knee pain x 5 weeks - Concern for DJD vs. Degenerative meniscal tear - Xrays show severe medial compartment DJD - Corticosteroid injection today - Stop ibuprofen, start mobic 15 mg daily - F/u prn  Procedure Note: Consent obtained. Risks and benefits discussed. Skin marked. Skin prepped with alcohol swab. Skin anesthetized with ethyl chloride spray. Right knee injected with 4 cc 1% lido and 1 cc depo 80 using a 22 gauge 1.5 inch needle from anteromedial approach. No complications. Well tolerated.

## 2014-06-29 ENCOUNTER — Emergency Department (HOSPITAL_COMMUNITY)
Admission: EM | Admit: 2014-06-29 | Discharge: 2014-06-29 | Disposition: A | Discharge status: Home / Self Care | Payer: BLUE CROSS/BLUE SHIELD | Attending: Emergency Medicine | Admitting: Emergency Medicine

## 2014-06-29 ENCOUNTER — Encounter (HOSPITAL_COMMUNITY): Payer: Self-pay | Admitting: *Deleted

## 2014-06-29 DIAGNOSIS — Z8719 Personal history of other diseases of the digestive system: Secondary | ICD-10-CM | POA: Diagnosis not present

## 2014-06-29 DIAGNOSIS — Z791 Long term (current) use of non-steroidal anti-inflammatories (NSAID): Secondary | ICD-10-CM | POA: Insufficient documentation

## 2014-06-29 DIAGNOSIS — M6289 Other specified disorders of muscle: Secondary | ICD-10-CM

## 2014-06-29 DIAGNOSIS — M545 Low back pain, unspecified: Secondary | ICD-10-CM

## 2014-06-29 MED ORDER — HYDROCODONE-ACETAMINOPHEN 5-325 MG PO TABS
1.0000 | ORAL_TABLET | Freq: Once | ORAL | Status: AC
  Administered 2014-06-29: 1 via ORAL
  Filled 2014-06-29: qty 1

## 2014-06-29 MED ORDER — KETOROLAC TROMETHAMINE 60 MG/2ML IM SOLN
60.0000 mg | Freq: Once | INTRAMUSCULAR | Status: AC
  Administered 2014-06-29: 60 mg via INTRAMUSCULAR
  Filled 2014-06-29: qty 2

## 2014-06-29 MED ORDER — METHOCARBAMOL 500 MG PO TABS: 500.0000 mg | ORAL_TABLET | Freq: Two times a day (BID) | ORAL | Status: DC

## 2014-06-29 NOTE — ED Provider Notes (Signed)
CSN: [id number]     Arrival date & time 06/29/14  1046 History   First MD Initiated Contact with Patient 06/29/14 1102     Chief Complaint  Patient presents with  . Back Pain     (Consider location/radiation/quality/duration/timing/severity/associated sxs/prior Treatment) HPI  Shaun Steele is a 54 y.o. male with PMH of umbilical hernia presenting with right-sided low back pain that started one week ago after playing soccer. Patient states it feels sharp and is worse with movement. He is taken ibuprofen without significant relief. He denies any alleviating factors. No abdominal or urinary symptoms. No fevers, chills, night sweats, weight loss, history of malignancy. No loss of control of bladder or bowel. No numbness/tingling, weakness or saddle anesthesia.     Past Medical History  Diagnosis Date  . Umbilical hernia    Past Surgical History  Procedure Laterality Date  . Hernia repair     No family history on file. History  Substance Use Topics  . Smoking status: Never Smoker   . Smokeless tobacco: Not on file  . Alcohol Use: No    Review of Systems  Constitutional: Negative for fever and chills.  Genitourinary: Negative for frequency and difficulty urinating.  Musculoskeletal: Positive for back pain. Negative for gait problem.  Skin: Negative for color change and wound.      Allergies  Review of patient's allergies indicates no known allergies.  Home Medications   Prior to Admission medications   Medication Sig Start Date End Date Taking? Authorizing Provider  ibuprofen (ADVIL,MOTRIN) 200 MG tablet Take 400 mg by mouth every 6 (six) hours as needed for mild pain or moderate pain.   Yes Historical Provider, MD  meloxicam (MOBIC) 15 MG tablet Take 1 tablet (15 mg total) by mouth daily. Patient not taking: Reported on 06/29/2014 08/22/13   Daine Gip, MD  methocarbamol (ROBAXIN) 500 MG tablet Take 1 tablet (500 mg total) by mouth 2 (two) times daily. 06/29/14   Oswaldo Conroy, PA-C   BP 133/79 mmHg  Pulse 66  Temp(Src) 98.4 F (36.9 C) (Oral)  Resp 14  SpO2 99% Physical Exam  Constitutional: He appears well-developed and well-nourished. No distress.  HENT:  Head: Normocephalic and atraumatic.  Eyes: Conjunctivae are normal. Right eye exhibits no discharge. Left eye exhibits no discharge.  Cardiovascular: Normal rate, regular rhythm and normal heart sounds.   Pulmonary/Chest: Effort normal and breath sounds normal. No respiratory distress. He has no wheezes.  Abdominal: Soft. Bowel sounds are normal. He exhibits no distension. There is no tenderness.  Musculoskeletal:  No midline back tenderness, step off or crepitus. Right sided mid to lower back tenderness with discrete muscle tightness. No CVA tenderness.   Neurological: He is alert. Coordination normal.  Equal muscle tone. 5/5 strength in lower extremities. DTR equal and intact. Negative straight leg test. Normal gait.   Skin: Skin is warm and dry. He is not diaphoretic.  Nursing note and vitals reviewed.   ED Course  Procedures (including critical care time) Labs Review Labs Reviewed - No data to display  Imaging Review No results found.   EKG Interpretation None      MDM   Final diagnoses:  Muscle tightness  Right-sided low back pain without sciatica   Patient presenting with muscle tightness in right midthoracic area worse with movement. No neurological symptoms. Neuro exam normal and patient ambulatory. I doubt cauda equina. Patient with significant improvement in the ED. Patient given prescription for Robaxin. Driving and sedation precautions  provided. Patient to follow-up with PCP/orthopedist for persistent symptoms.  Discussed return precautions with patient. Discussed all results and patient verbalizes understanding and agrees with plan.   Oswaldo ConroyVictoria Konner Warrior, PA-C 06/29/14 1226  Geoffery Lyonsouglas Delo, MD 06/30/14 1235

## 2014-06-29 NOTE — Discharge Instructions (Signed)
Return to the emergency room with worsening of symptoms, new symptoms or with symptoms that are concerning, , especially fevers, loss of control of bladder or bowels, numbness or tingling around genital region or anus, weakness. RICE: Rest, Ice (three cycles of 20 mins on, 32mins off at least twice a day), compression/brace, elevation. Heating pad works well for back pain. Ibuprofen $RemoveBeforeD'400mg'jnfNBQPmBErzRk$  (2 tablets $RemoveBe'200mg'BkogCMDol$ ) every 5-6 hours for 3-5 days. Robaxin for severe pain. Do not operate machinery, drive or drink alcohol while taking narcotics or muscle relaxers. Follow up with PCP/orthopedist if symptoms worsen or are persistent. Read below information and follow recommendations.  Back Injury Prevention Back injuries can be extremely painful and difficult to heal. After having one back injury, you are much more likely to experience another later on. It is important to learn how to avoid injuring or re-injuring your back. The following tips can help you to prevent a back injury. PHYSICAL FITNESS  Exercise regularly and try to develop good tone in your abdominal muscles. Your abdominal muscles provide a lot of the support needed by your back.  Do aerobic exercises (walking, jogging, biking, swimming) regularly.  Do exercises that increase balance and strength (tai chi, yoga) regularly. This can decrease your risk of falling and injuring your back.  Stretch before and after exercising.  Maintain a healthy weight. The more you weigh, the more stress is placed on your back. For every pound of weight, 10 times that amount of pressure is placed on the back. DIET  Talk to your caregiver about how much calcium and vitamin D you need per day. These nutrients help to prevent weakening of the bones (osteoporosis). Osteoporosis can cause broken (fractured) bones that lead to back pain.  Include good sources of calcium in your diet, such as dairy products, green, leafy vegetables, and products with calcium added  (fortified).  Include good sources of vitamin D in your diet, such as milk and foods that are fortified with vitamin D.  Consider taking a nutritional supplement or a multivitamin if needed.  Stop smoking if you smoke. POSTURE  Sit and stand up straight. Avoid leaning forward when you sit or hunching over when you stand.  Choose chairs with good low back (lumbar) support.  If you work at a desk, sit close to your work so you do not need to lean over. Keep your chin tucked in. Keep your neck drawn back and elbows bent at a right angle. Your arms should look like the letter "L."  Sit high and close to the steering wheel when you drive. Add a lumbar support to your car seat if needed.  Avoid sitting or standing in one position for too long. Take breaks to get up, stretch, and walk around at least once every hour. Take breaks if you are driving for long periods of time.  Sleep on your side with your knees slightly bent, or sleep on your back with a pillow under your knees. Do not sleep on your stomach. LIFTING, TWISTING, AND REACHING  Avoid heavy lifting, especially repetitive lifting. If you must do heavy lifting:  Stretch before lifting.  Work slowly.  Rest between lifts.  Use carts and dollies to move objects when possible.  Make several small trips instead of carrying 1 heavy load.  Ask for help when you need it.  Ask for help when moving big, awkward objects.  Follow these steps when lifting:  Stand with your feet shoulder-width apart.  Get as close to the  object as you can. Do not try to pick up heavy objects that are far from your body.  Use handles or lifting straps if they are available.  Bend at your knees. Squat down, but keep your heels off the floor.  Keep your shoulders pulled back, your chin tucked in, and your back straight.  Lift the object slowly, tightening the muscles in your legs, abdomen, and buttocks. Keep the object as close to the center of your  body as possible.  When you put a load down, use these same guidelines in reverse.  Do not:  Lift the object above your waist.  Twist at the waist while lifting or carrying a load. Move your feet if you need to turn, not your waist.  Bend over without bending at your knees.  Avoid reaching over your head, across a table, or for an object on a high surface. OTHER TIPS  Avoid wet floors and keep sidewalks clear of ice to prevent falls.  Do not sleep on a mattress that is too soft or too hard.  Keep items that are used frequently within easy reach.  Put heavier objects on shelves at waist level and lighter objects on lower or higher shelves.  Find ways to decrease your stress, such as exercise, massage, or relaxation techniques. Stress can build up in your muscles. Tense muscles are more vulnerable to injury.  Seek treatment for depression or anxiety if needed. These conditions can increase your risk of developing back pain. SEEK MEDICAL CARE IF:  You injure your back.  You have questions about diet, exercise, or other ways to prevent back injuries. MAKE SURE YOU:  Understand these instructions.  Will watch your condition.  Will get help right away if you are not doing well or get worse. Document Released: 04/15/2004 Document Revised: 05/31/2011 Document Reviewed: 04/19/2011 [facility] Patient Information 2015 [place], [place]. This information is not intended to replace advice given to you by your health care provider. Make sure you discuss any questions you have with your health care provider.

## 2014-06-29 NOTE — ED Notes (Signed)
Pt reports R lower back pain starting Sunday after playing soccer Saturday/

## 2014-10-26 ENCOUNTER — Emergency Department (HOSPITAL_COMMUNITY)
Admission: EM | Admit: 2014-10-26 | Discharge: 2014-10-26 | Disposition: A | Discharge status: Home / Self Care | Payer: BLUE CROSS/BLUE SHIELD | Attending: Emergency Medicine | Admitting: Emergency Medicine

## 2014-10-26 ENCOUNTER — Encounter (HOSPITAL_COMMUNITY): Payer: Self-pay | Admitting: Emergency Medicine

## 2014-10-26 DIAGNOSIS — M549 Dorsalgia, unspecified: Secondary | ICD-10-CM

## 2014-10-26 DIAGNOSIS — M545 Low back pain: Secondary | ICD-10-CM | POA: Insufficient documentation

## 2014-10-26 DIAGNOSIS — Z8719 Personal history of other diseases of the digestive system: Secondary | ICD-10-CM | POA: Diagnosis not present

## 2014-10-26 MED ORDER — METHOCARBAMOL 500 MG PO TABS
500.0000 mg | ORAL_TABLET | Freq: Once | ORAL | Status: AC
  Administered 2014-10-26: 500 mg via ORAL
  Filled 2014-10-26: qty 1

## 2014-10-26 MED ORDER — METHOCARBAMOL 500 MG PO TABS: 500.0000 mg | ORAL_TABLET | Freq: Two times a day (BID) | ORAL | Status: DC

## 2014-10-26 MED ORDER — IBUPROFEN 800 MG PO TABS: 800.0000 mg | ORAL_TABLET | Freq: Three times a day (TID) | ORAL | Status: DC

## 2014-10-26 NOTE — ED Provider Notes (Signed)
CSN: [id number]     Arrival date & time 10/26/14  1146 History   First MD Initiated Contact with Patient 10/26/14 1152     Chief Complaint  Patient presents with  . Back Pain    low back pain x1 week after doing house work    HPI   Shaun Steele is a 54 year old male with a PMH of umbilical hernia repair who presents to the ED with 1 week of low back pain, which began after doing housework. He reports his pain is located over both sides of his lower back. He states bending over exacerbates his pain. He has tried taking ibuprofen at night time, with no significant relief. He denies radiation of pain, numbness, paresthesia, weakness, saddle anesthesia, bowel/bladder incontinence, IVDU, anticoagulant use, and history of malignancy.   Past Medical History  Diagnosis Date  . Umbilical hernia    Past Surgical History  Procedure Laterality Date  . Hernia repair     No family history on file. History  Substance Use Topics  . Smoking status: Never Smoker   . Smokeless tobacco: Not on file  . Alcohol Use: No     Review of Systems  Constitutional: Negative for fever and chills.  Respiratory: Negative for shortness of breath.   Cardiovascular: Negative for chest pain.  Gastrointestinal: Negative for nausea, vomiting, abdominal pain, diarrhea and constipation.  Genitourinary: Negative for dysuria, urgency and frequency.  Musculoskeletal: Positive for myalgias and back pain. Negative for gait problem.       Reports low back pain bilaterally  Skin: Negative for color change and rash.  Neurological: Negative for dizziness, weakness, light-headedness, numbness and headaches.      Allergies  Review of patient's allergies indicates no known allergies.  Home Medications   Prior to Admission medications   Medication Sig Start Date End Date Taking? Authorizing Provider  ibuprofen (ADVIL,MOTRIN) 800 MG tablet Take 1 tablet (800 mg total) by mouth 3 (three) times daily. 10/26/14   Mady Gemma, PA-C  meloxicam (MOBIC) 15 MG tablet Take 1 tablet (15 mg total) by mouth daily. Patient not taking: Reported on 06/29/2014 08/22/13   Daine Gip, MD  methocarbamol (ROBAXIN) 500 MG tablet Take 1 tablet (500 mg total) by mouth 2 (two) times daily. 10/26/14   Mady Gemma, PA-C    BP 145/96 mmHg  Pulse 65  Temp(Src) 98 F (36.7 C) (Oral)  Resp 16  Ht 6\' 5"  (1.956 m)  Wt 210 lb (95.255 kg)  BMI 24.90 kg/m2  SpO2 100% Physical Exam  Constitutional: He is oriented to person, place, and time. He appears well-developed and well-nourished.  HENT:  Head: Normocephalic and atraumatic.  Cardiovascular: Normal rate and intact distal pulses.   Pulmonary/Chest: Effort normal and breath sounds normal.  Abdominal: There is no CVA tenderness.  Musculoskeletal: Normal range of motion. He exhibits tenderness.  Tenderness to palpation over lumbar paraspinal muscles bilaterally. No midline tenderness, step off, or deformity.  Neurological: He is alert and oriented to person, place, and time. He has normal strength and normal reflexes. No sensory deficit.  Strength 5/5 in lower extremities bilaterally. Sensation intact. Gait normal.   Skin: Skin is warm and dry. No rash noted. No erythema.  Psychiatric: He has a normal mood and affect. His behavior is normal.  Nursing note and vitals reviewed.   ED Course  Procedures (including critical care time) Labs Review Labs Reviewed - No data to display  Imaging Review No results  found.   EKG Interpretation None      MDM   Final diagnoses:  Back pain, unspecified location    54 yo male presents with low back pain x 1 week. No neurological deficits and normal neuro exam. TTP over paraspinal muscles of lumbar spine. Strength and sensation of lower extremities intact bilaterally. Patient can walk, denies pain with ambulation. No loss of bowel or bladder control. No concern for cauda equina. No fever, night sweats, weight loss, h/o  malignancy, IVDU. Most likely musculoskeletal lumbar strain. RICE protocol, antiinflammatory use, and muscle relaxant indicated and discussed with patient. Return precautions given. Patient to follow-up with PCP within the next week.   BP 145/96 mmHg  Pulse 65  Temp(Src) 98 F (36.7 C) (Oral)  Resp 16  Ht  (1.956 m)  Wt 210 lb (95.255 kg)  BMI 24.90 kg/m2  SpO2 100%      Mady Gemma, PA-C 10/26/14 1248  Melene Plan, DO 10/26/14 1559

## 2014-10-26 NOTE — ED Notes (Signed)
Pt A+Ox4, reports low back pain x1 week, onset after working on a house.  Pt denies n/t to extremities, denies b/b change or complaints.  Reports no relief with ibuprofen.  MAEI, ambulatory with steady gait.  Speaking full/clear sentences, rr even/un-lab.  Skin PWD.  NAD.

## 2014-10-26 NOTE — Discharge Instructions (Signed)
1. Medications: ibuprofen, robaxin, usual home medications 2. Treatment: rest, drink plenty of fluids, heat, back exercises 3. Follow Up: please followup with your primary doctor within the next week for discussion of your diagnoses and further evaluation after today's visit; if you do not have a primary care doctor use the resource guide provided to find one; please return to the ER for new or worsening symptoms   Back Exercises Back exercises help treat and prevent back injuries. The goal of back exercises is to increase the strength of your abdominal and back muscles and the flexibility of your back. These exercises should be started when you no longer have back pain. Back exercises include:  Pelvic Tilt. Lie on your back with your knees bent. Tilt your pelvis until the lower part of your back is against the floor. Hold this position 5 to 10 sec and repeat 5 to 10 times.  Knee to Chest. Pull first 1 knee up against your chest and hold for 20 to 30 seconds, repeat this with the other knee, and then both knees. This may be done with the other leg straight or bent, whichever feels better.  Sit-Ups or Curl-Ups. Bend your knees 90 degrees. Start with tilting your pelvis, and do a partial, slow sit-up, lifting your trunk only 30 to 45 degrees off the floor. Take at least 2 to 3 seconds for each sit-up. Do not do sit-ups with your knees out straight. If partial sit-ups are difficult, simply do the above but with only tightening your abdominal muscles and holding it as directed.  Hip-Lift. Lie on your back with your knees flexed 90 degrees. Push down with your feet and shoulders as you raise your hips a couple inches off the floor; hold for 10 seconds, repeat 5 to 10 times.  Back arches. Lie on your stomach, propping yourself up on bent elbows. Slowly press on your hands, causing an arch in your low back. Repeat 3 to 5 times. Any initial stiffness and discomfort should lessen with repetition over  time.  Shoulder-Lifts. Lie face down with arms beside your body. Keep hips and torso pressed to floor as you slowly lift your head and shoulders off the floor. Do not overdo your exercises, especially in the beginning. Exercises may cause you some mild back discomfort which lasts for a few minutes; however, if the pain is more severe, or lasts for more than 15 minutes, do not continue exercises until you see your caregiver. Improvement with exercise therapy for back problems is slow.  See your caregivers for assistance with developing a proper back exercise program. Document Released: 04/15/2004 Document Revised: 05/31/2011 Document Reviewed: 01/07/2011 [facility] Patient Information 2015 [place], [place]. This information is not intended to replace advice given to you by your health care provider. Make sure you discuss any questions you have with your health care provider.  Back Pain, Adult Low back pain is very common. About 1 in 5 people have back pain.The cause of low back pain is rarely dangerous. The pain often gets better over time.About half of people with a sudden onset of back pain feel better in just 2 weeks. About 8 in 10 people feel better by 6 weeks.  CAUSES Some common causes of back pain include:  Strain of the muscles or ligaments supporting the spine.  Wear and tear (degeneration) of the spinal discs.  Arthritis.  Direct injury to the back. DIAGNOSIS Most of the time, the direct cause of low back pain is not known.However,  back pain can be treated effectively even when the exact cause of the pain is unknown.Answering your caregiver's questions about your overall health and symptoms is one of the most accurate ways to make sure the cause of your pain is not dangerous. If your caregiver needs more information, he or she may order lab work or imaging tests (X-rays or MRIs).However, even if imaging tests show changes in your back, this usually does not require surgery. HOME CARE  INSTRUCTIONS For many people, back pain returns.Since low back pain is rarely dangerous, it is often a condition that people can learn to [facility] their own.   Remain active. It is stressful on the back to sit or stand in one place. Do not sit, drive, or stand in one place for more than 30 minutes at a time. Take short walks on level surfaces as soon as pain allows.Try to increase the length of time you walk each day.  Do not stay in bed.Resting more than 1 or 2 days can delay your recovery.  Do not avoid exercise or work.Your body is made to move.It is not dangerous to be active, even though your back may hurt.Your back will likely heal faster if you return to being active before your pain is gone.  Pay attention to your body when you bend and lift. Many people have less discomfortwhen lifting if they bend their knees, keep the load close to their bodies,and avoid twisting. Often, the most comfortable positions are those that put less stress on your recovering back.  Find a comfortable position to sleep. Use a firm mattress and lie on your side with your knees slightly bent. If you lie on your back, put a pillow under your knees.  Only take over-the-counter or prescription medicines as directed by your caregiver. Over-the-counter medicines to reduce pain and inflammation are often the most helpful.Your caregiver may prescribe muscle relaxant drugs.These medicines help dull your pain so you can more quickly return to your normal activities and healthy exercise.  Put ice on the injured area.  Put ice in a plastic bag.  Place a towel between your skin and the bag.  Leave the ice on for 15-20 minutes, 03-04 times a day for the first 2 to 3 days. After that, ice and heat may be alternated to reduce pain and spasms.  Ask your caregiver about trying back exercises and gentle massage. This may be of some benefit.  Avoid feeling anxious or stressed.Stress increases muscle tension and  can worsen back pain.It is important to recognize when you are anxious or stressed and learn ways to manage it.Exercise is a great option. SEEK MEDICAL CARE IF:  You have pain that is not relieved with rest or medicine.  You have pain that does not improve in 1 week.  You have new symptoms.  You are generally not feeling well. SEEK IMMEDIATE MEDICAL CARE IF:   You have pain that radiates from your back into your legs.  You develop new bowel or bladder control problems.  You have unusual weakness or numbness in your arms or legs.  You develop nausea or vomiting.  You develop abdominal pain.  You feel faint. Document Released: 03/08/2005 Document Revised: 09/07/2011 Document Reviewed: 07/10/2013 [facility] Patient Information 2015 [place], [place]. This information is not intended to replace advice given to you by your health care provider. Make sure you discuss any questions you have with your health care provider.

## 2015-03-02 ENCOUNTER — Encounter (HOSPITAL_COMMUNITY): Payer: Self-pay

## 2015-03-02 ENCOUNTER — Emergency Department (HOSPITAL_COMMUNITY)
Admission: EM | Admit: 2015-03-02 | Discharge: 2015-03-02 | Disposition: A | Discharge status: Home / Self Care | Payer: BLUE CROSS/BLUE SHIELD | Attending: Emergency Medicine | Admitting: Emergency Medicine

## 2015-03-02 DIAGNOSIS — M545 Low back pain, unspecified: Secondary | ICD-10-CM

## 2015-03-02 DIAGNOSIS — Z8719 Personal history of other diseases of the digestive system: Secondary | ICD-10-CM | POA: Insufficient documentation

## 2015-03-02 DIAGNOSIS — Z79899 Other long term (current) drug therapy: Secondary | ICD-10-CM | POA: Insufficient documentation

## 2015-03-02 MED ORDER — TRAMADOL HCL 50 MG PO TABS
50.0000 mg | ORAL_TABLET | Freq: Once | ORAL | Status: AC
  Administered 2015-03-02: 50 mg via ORAL
  Filled 2015-03-02: qty 1

## 2015-03-02 MED ORDER — TRAMADOL HCL 50 MG PO TABS: 50.0000 mg | ORAL_TABLET | Freq: Two times a day (BID) | ORAL | Status: DC | PRN

## 2015-03-02 MED ORDER — DEXAMETHASONE SODIUM PHOSPHATE 10 MG/ML IJ SOLN
6.0000 mg | Freq: Once | INTRAMUSCULAR | Status: AC
  Administered 2015-03-02: 6 mg via INTRAMUSCULAR
  Filled 2015-03-02: qty 1

## 2015-03-02 MED ORDER — PREDNISONE 20 MG PO TABS: 40.0000 mg | ORAL_TABLET | Freq: Every day | ORAL | Status: DC

## 2015-03-02 MED ORDER — IBUPROFEN 800 MG PO TABS: 800.0000 mg | ORAL_TABLET | Freq: Three times a day (TID) | ORAL | Status: DC

## 2015-03-02 MED ORDER — KETOROLAC TROMETHAMINE 60 MG/2ML IM SOLN
60.0000 mg | Freq: Once | INTRAMUSCULAR | Status: AC
  Administered 2015-03-02: 60 mg via INTRAMUSCULAR
  Filled 2015-03-02: qty 2

## 2015-03-02 MED ORDER — CYCLOBENZAPRINE HCL 10 MG PO TABS: 10.0000 mg | ORAL_TABLET | Freq: Two times a day (BID) | ORAL | Status: DC | PRN

## 2015-03-02 NOTE — ED Provider Notes (Signed)
CSN: [id number]     Arrival date & time 03/02/15  1803 History   First MD Initiated Contact with Patient 03/02/15 1830     Chief Complaint  Patient presents with  . Back Pain     (Consider location/radiation/quality/duration/timing/severity/associated sxs/prior Treatment) Patient is a 54 y.o. male presenting with back pain. The history is provided by the patient.  Back Pain Location:  Lumbar spine Quality:  Stabbing Radiates to: radiates across low back. Pain severity:  Severe (10/10) Pain is:  Same all the time Onset quality:  Gradual Duration:  2 days Timing:  Constant Progression:  Worsening Chronicity:  New Context: not falling, not jumping from heights, not lifting heavy objects, not MCA, not MVA, not occupational injury, not physical stress, not recent illness, not recent injury and not twisting   Relieved by:  Nothing Worsened by:  Bending Ineffective treatments:  Ibuprofen Associated symptoms: no abdominal pain, no abdominal swelling, no bladder incontinence, no bowel incontinence, no chest pain, no dysuria, no fever, no headaches, no leg pain, no numbness, no paresthesias, no perianal numbness, no tingling, no weakness and no weight loss   Risk factors: no hx of cancer, no recent surgery and no steroid use     The patient presents to the ER for evaluation of left low back pain for 2 days described as stabbing, rated 10 out of 10 with radiation across his low back. His pain is worse with bending over. He has found no alleviating factors. He has tried ibuprofen without any improvement. He denies any injury or history of back pain.    Past Medical History  Diagnosis Date  . Umbilical hernia    Past Surgical History  Procedure Laterality Date  . Hernia repair     No family history on file. Social History  Substance Use Topics  . Smoking status: Never Smoker   . Smokeless tobacco: None  . Alcohol Use: No    Review of Systems  Constitutional: Negative.  Negative  for fever, chills, weight loss and fatigue.  HENT: Negative.   Respiratory: Negative.   Cardiovascular: Negative.  Negative for chest pain.  Gastrointestinal: Negative for abdominal pain and bowel incontinence.  Genitourinary: Negative.  Negative for bladder incontinence, dysuria, urgency, hematuria, flank pain, discharge, penile swelling, scrotal swelling, difficulty urinating and penile pain.  Musculoskeletal: Positive for back pain. Negative for myalgias, joint swelling, gait problem, neck pain and neck stiffness.  Neurological: Negative.  Negative for tingling, weakness, numbness, headaches and paresthesias.  All other systems reviewed and are negative.     Allergies  Review of patient's allergies indicates no known allergies.  Home Medications   Prior to Admission medications   Medication Sig Start Date End Date Taking? Authorizing Provider  cyclobenzaprine (FLEXERIL) 10 MG tablet Take 1 tablet (10 mg total) by mouth 2 (two) times daily as needed for muscle spasms. 03/02/15   Danelle BerryLeisa Lajuan Kovaleski, PA-C  ibuprofen (ADVIL,MOTRIN) 800 MG tablet Take 1 tablet (800 mg total) by mouth 3 (three) times daily. 03/02/15   Danelle BerryLeisa Rahi Chandonnet, PA-C  meloxicam (MOBIC) 15 MG tablet Take 1 tablet (15 mg total) by mouth daily. Patient not taking: Reported on 06/29/2014 08/22/13   Daine GipErin L Voss, MD  methocarbamol (ROBAXIN) 500 MG tablet Take 1 tablet (500 mg total) by mouth 2 (two) times daily. 10/26/14   Mady GemmaElizabeth C Westfall, PA-C  predniSONE (DELTASONE) 20 MG tablet Take 2 tablets (40 mg total) by mouth daily. Take 40 mg by mouth daily for 3 days, then   by mouth daily for 3 days, then  daily for 3 days 03/02/15   Danelle Berry, PA-C  traMADol (ULTRAM) 50 MG tablet Take 1 tablet (50 mg total) by mouth every 12 (twelve) hours as needed for severe pain. 03/02/15   Danelle Berry, PA-C   BP 138/78 mmHg  Pulse 88  Temp(Src) 98.1 F (36.7 C) (Oral)  Resp 18  SpO2 100% Physical Exam  Constitutional: He is oriented to  person, place, and time. He appears well-developed and well-nourished. No distress.  HENT:  Head: Normocephalic and atraumatic.  Right Ear: External ear normal.  Left Ear: External ear normal.  Nose: Nose normal.  Mouth/Throat: Oropharynx is clear and moist. No oropharyngeal exudate.  Eyes: Conjunctivae and EOM are normal. Pupils are equal, round, and reactive to light. Right eye exhibits no discharge. Left eye exhibits no discharge. No scleral icterus.  Neck: Normal range of motion. Neck supple. No JVD present. No tracheal deviation present.  Cardiovascular: Normal rate, regular rhythm, normal heart sounds and intact distal pulses.  Exam reveals no gallop and no friction rub.   No murmur heard. Pulmonary/Chest: Effort normal and breath sounds normal. No stridor. No respiratory distress. He has no wheezes. He has no rales. He exhibits no tenderness.  Abdominal: Soft. Bowel sounds are normal. He exhibits no distension. There is no tenderness.  Musculoskeletal: Normal range of motion. He exhibits no edema.       Cervical back: Normal.       Thoracic back: Normal.       Lumbar back: He exhibits tenderness and spasm. He exhibits normal range of motion, no bony tenderness, no swelling, no edema, no deformity and no pain.       Back:  Positive straight leg raise No lumbar spinal process tenderness, no step off Lumbar paraspinal muscle ttp Left IT band tenderness, left hip normal active and passive ROM No SI joint pain bilaterally  Lymphadenopathy:    He has no cervical adenopathy.  Neurological: He is alert and oriented to person, place, and time. He has normal strength. He displays no tremor. No cranial nerve deficit or sensory deficit. He exhibits normal muscle tone. He displays no seizure activity. Coordination and gait normal.  Speech is clear and goal oriented, follows commands Major Cranial nerves without deficit, no facial droop Normal strength in upper and lower extremities bilaterally  including dorsiflexion and plantar flexion, strong and equal grip strength Sensation normal to light touch in LE Moves extremities without ataxia, coordination intact Normal gait and balance   Skin: Skin is warm and dry. No rash noted. He is not diaphoretic. No erythema. No pallor.  Psychiatric: He has a normal mood and affect. His behavior is normal. Judgment and thought content normal.  Nursing note and vitals reviewed.   ED Course  Procedures (including critical care time) Labs Review Labs Reviewed - No data to display  Imaging Review No results found. I have personally reviewed and evaluated these images and lab results as part of my medical decision-making.   EKG Interpretation None      MDM   Patient with back pain.  No neurological deficits and normal neuro exam.  Patient can walk but states is painful.  No loss of bowel or bladder control.  No concern for cauda equina.  No fever, night sweats, weight loss, h/o cancer, IVDU.  RICE protocol and pain medicine indicated and discussed with patient.  Medications  dexamethasone (DECADRON) injection 6 mg (6 mg Intramuscular Given 03/02/15  1900)  ketorolac (TORADOL) injection 60 mg (60 mg Intramuscular Given 03/02/15 1901)  traMADol (ULTRAM) tablet 50 mg (50 mg Oral Given 03/02/15 1900)    Final diagnoses:  Bilateral low back pain without sciatica   Pt d/c'd in stable condition, steady gait. Filed Vitals:   03/02/15 1808  BP: 138/78  Pulse: 88  Temp: 98.1 F (36.7 C)  Resp: 18   Discharge medications: prednisone taper, flexeril, tramadol, ibuprofen  Pt to follow up with PCP in 3-5 days if not seeing mild improvement.       Danelle Berry, PA-C 03/02/15 1907  Laurence Spates, MD 03/03/15 [phone]

## 2015-03-02 NOTE — ED Notes (Signed)
He c/o low back pain "all across my low back, worse on the right".  He is in no distress.  Pain is worse with bending.

## 2015-03-02 NOTE — Discharge Instructions (Signed)
Lumbosacral Strain Lumbosacral strain is a strain of any of the parts that make up your lumbosacral vertebrae. Your lumbosacral vertebrae are the bones that make up the lower third of your backbone. Your lumbosacral vertebrae are held together by muscles and tough, fibrous tissue (ligaments).  CAUSES  A sudden blow to your back can cause lumbosacral strain. Also, anything that causes an excessive stretch of the muscles in the low back can cause this strain. This is typically seen when people exert themselves strenuously, fall, lift heavy objects, bend, or crouch repeatedly. RISK FACTORS  Physically demanding work.  Participation in pushing or pulling sports or sports that require a sudden twist of the back (tennis, golf, baseball).  Weight lifting.  Excessive lower back curvature.  Forward-tilted pelvis.  Weak back or abdominal muscles or both.  Tight hamstrings. SIGNS AND SYMPTOMS  Lumbosacral strain may cause pain in the area of your injury or pain that moves (radiates) down your leg.  DIAGNOSIS Your health care provider can often diagnose lumbosacral strain through a physical exam. In some cases, you may need tests such as X-ray exams.  TREATMENT  Treatment for your lower back injury depends on many factors that your clinician will have to evaluate. However, most treatment will include the use of anti-inflammatory medicines. HOME CARE INSTRUCTIONS   Avoid hard physical activities (tennis, racquetball, waterskiing) if you are not in proper physical condition for it. This may aggravate or create problems.  If you have a back problem, avoid sports requiring sudden body movements. Swimming and walking are generally safer activities.  Maintain good posture.  Maintain a healthy weight.  For acute conditions, you may put ice on the injured area.  Put ice in a plastic bag.  Place a towel between your skin and the bag.  Leave the ice on for 20 minutes, 2-3 times a day.  When the  low back starts healing, stretching and strengthening exercises may be recommended. SEEK MEDICAL CARE IF:  Your back pain is getting worse.  You experience severe back pain not relieved with medicines. SEEK IMMEDIATE MEDICAL CARE IF:   You have numbness, tingling, weakness, or problems with the use of your arms or legs.  There is a change in bowel or bladder control.  You have increasing pain in any area of the body, including your belly (abdomen).  You notice shortness of breath, dizziness, or feel faint.  You feel sick to your stomach (nauseous), are throwing up (vomiting), or become sweaty.  You notice discoloration of your toes or legs, or your feet get very cold. MAKE SURE YOU:   Understand these instructions.  Will watch your condition.  Will get help right away if you are not doing well or get worse.   This information is not intended to replace advice given to you by your health care provider. Make sure you discuss any questions you have with your health care provider.   Document Released: 12/16/2004 Document Revised: 03/29/2014 Document Reviewed: 10/25/2012 Elsevier Interactive Patient Education 2016 [employer]  Back Pain, Adult Back pain is very common in adults.The cause of back pain is rarely dangerous and the pain often gets better over time.The cause of your back pain may not be known. Some common causes of back pain include:  Strain of the muscles or ligaments supporting the spine.  Wear and tear (degeneration) of the spinal disks.  Arthritis.  Direct injury to the back. For many people, back pain may return. Since back pain is rarely  dangerous, most people can learn to manage this condition on their own. HOME CARE INSTRUCTIONS Watch your back pain for any changes. The following actions may help to lessen any discomfort you are feeling:  Remain active. It is stressful on your back to sit or stand in one place for long periods of time. Do not sit,  drive, or stand in one place for more than 30 minutes at a time. Take short walks on even surfaces as soon as you are able.Try to increase the length of time you walk each day.  Exercise regularly as directed by your health care provider. Exercise helps your back heal faster. It also helps avoid future injury by keeping your muscles strong and flexible.  Do not stay in bed.Resting more than 1-2 days can delay your recovery.  Pay attention to your body when you bend and lift. The most comfortable positions are those that put less stress on your recovering back. Always use proper lifting techniques, including:  Bending your knees.  Keeping the load close to your body.  Avoiding twisting.  Find a comfortable position to sleep. Use a firm mattress and lie on your side with your knees slightly bent. If you lie on your back, put a pillow under your knees.  Avoid feeling anxious or stressed.Stress increases muscle tension and can worsen back pain.It is important to recognize when you are anxious or stressed and learn ways to manage it, such as with exercise.  Take medicines only as directed by your health care provider. Over-the-counter medicines to reduce pain and inflammation are often the most helpful.Your health care provider may prescribe muscle relaxant drugs.These medicines help dull your pain so you can more quickly return to your normal activities and healthy exercise.  Apply ice to the injured area:  Put ice in a plastic bag.  Place a towel between your skin and the bag.  Leave the ice on for 20 minutes, 2-3 times a day for the first 2-3 days. After that, ice and heat may be alternated to reduce pain and spasms.  Maintain a healthy weight. Excess weight puts extra stress on your back and makes it difficult to maintain good posture. SEEK MEDICAL CARE IF:  You have pain that is not relieved with rest or medicine.  You have increasing pain going down into the legs or  buttocks.  You have pain that does not improve in one week.  You have night pain.  You lose weight.  You have a fever or chills. SEEK IMMEDIATE MEDICAL CARE IF:   You develop new bowel or bladder control problems.  You have unusual weakness or numbness in your arms or legs.  You develop nausea or vomiting.  You develop abdominal pain.  You feel faint.   This information is not intended to replace advice given to you by your health care provider. Make sure you discuss any questions you have with your health care provider.   Document Released: 03/08/2005 Document Revised: 03/29/2014 Document Reviewed: 07/10/2013 Elsevier Interactive Patient Education 2016 [employer] Inc.  [employer] Therapy Heat therapy can help ease sore, stiff, injured, and tight muscles and joints. Heat relaxes your muscles, which may help ease your pain. Heat therapy should only be used on old, pre-existing, or long-lasting (chronic) injuries. Do not use heat therapy unless told by your doctor. HOW TO USE HEAT THERAPY There are several different kinds of heat therapy, including:  Moist heat pack.  Warm water bath.  Hot water bottle.  Electric heating  pad.  Heated gel pack.  Heated wrap.  Electric heating pad. GENERAL HEAT THERAPY RECOMMENDATIONS   Do not sleep while using heat therapy. Only use heat therapy while you are awake.  Your skin may turn pink while using heat therapy. Do not use heat therapy if your skin turns red.  Do not use heat therapy if you have new pain.  High heat or long exposure to heat can cause burns. Be careful when using heat therapy to avoid burning your skin.  Do not use heat therapy on areas of your skin that are already irritated, such as with a rash or sunburn. GET HELP IF:   You have blisters, redness, swelling (puffiness), or numbness.  You have new pain.  Your pain is worse. MAKE SURE YOU:  Understand these instructions.  Will watch your condition.  Will  get help right away if you are not doing well or get worse.   This information is not intended to replace advice given to you by your health care provider. Make sure you discuss any questions you have with your health care provider.   Document Released: 05/31/2011 Document Revised: 03/29/2014 Document Reviewed: 05/01/2013 Elsevier Interactive Patient Education 2016 Else[employer] Inc[employer] Cryotherapy Cryotherapy means treatment with cold. Ice or gel packs can be used to reduce both pain and swelling. Ice is the most helpful within the first 24 to 48 hours after an injury or flare-up from overusing a muscle or joint. Sprains, strains, spasms, burning pain, shooting pain, and aches can all be eased with ice. Ice can also be used when recovering from surgery. Ice is effective, has very few side effects, and is safe for most people to use. PRECAUTIONS  Ice is not a safe treatment option for people with:  Raynaud phenomenon. This is a condition affecting small blood vessels in the extremities. Exposure to cold may cause your problems to return.  Cold hypersensitivity. There are many forms of cold hypersensitivity, including:  Cold urticaria. Red, itchy hives appear on the skin when the tissues begin to warm after being iced.  Cold erythema. This is a red, itchy rash caused by exposure to cold.  Cold hemoglobinuria. Red blood cells break down when the tissues begin to warm after being iced. The hemoglobin that carry oxygen are passed into the urine because they cannot combine with blood proteins fast enough.  Numbness or altered sensitivity in the area being iced. If you have any of the following conditions, do not use ice until you have discussed cryotherapy with your caregiver:  Heart conditions, such as arrhythmia, angina, or chronic heart disease.  High blood pressure.  Healing wounds or open skin in the area being iced.  Current infections.  Rheumatoid arthritis.  Poor  circulation.  Diabetes. Ice slows the blood flow in the region it is applied. This is beneficial when trying to stop inflamed tissues from spreading irritating chemicals to surrounding tissues. However, if you expose your skin to cold temperatures for too long or without the proper protection, you can damage your skin or nerves. Watch for signs of skin damage due to cold. HOME CARE INSTRUCTIONS Follow these tips to use ice and cold packs safely.  Place a dry or damp towel between the ice and skin. A damp towel will cool the skin more quickly, so you may need to shorten the time that the ice is used.  For a more rapid response, add gentle compression to the ice.  Ice for no more than 10 to  20 minutes at a time. The bonier the area you are icing, the less time it will take to get the benefits of ice.  Check your skin after 5 minutes to make sure there are no signs of a poor response to cold or skin damage.  Rest 20 minutes or more between uses.  Once your skin is numb, you can end your treatment. You can test numbness by very lightly touching your skin. The touch should be so light that you do not see the skin dimple from the pressure of your fingertip. When using ice, most people will feel these normal sensations in this order: cold, burning, aching, and numbness.  Do not use ice on someone who cannot communicate their responses to pain, such as small children or people with dementia. HOW TO MAKE AN ICE PACK Ice packs are the most common way to use ice therapy. Other methods include ice massage, ice baths, and cryosprays. Muscle creams that cause a cold, tingly feeling do not offer the same benefits that ice offers and should not be used as a substitute unless recommended by your caregiver. To make an ice pack, do one of the following:  Place crushed ice or a bag of frozen vegetables in a sealable plastic bag. Squeeze out the excess air. Place this bag inside another plastic bag. Slide the bag  into a pillowcase or place a damp towel between your skin and the bag.  Mix 3 parts water with 1 part rubbing alcohol. Freeze the mixture in a sealable plastic bag. When you remove the mixture from the freezer, it will be slushy. Squeeze out the excess air. Place this bag inside another plastic bag. Slide the bag into a pillowcase or place a damp towel between your skin and the bag. SEEK MEDICAL CARE IF:  You develop white spots on your skin. This may give the skin a blotchy (mottled) appearance.  Your skin turns blue or pale.  Your skin becomes waxy or hard.  Your swelling gets worse. MAKE SURE YOU:   Understand these instructions.  Will watch your condition.  Will get help right away if you are not doing well or get worse.   This information is not intended to replace advice given to you by your health care provider. Make sure you discuss any questions you have with your health care provider.   Document Released: 11/02/2010 Document Revised: 03/29/2014 Document Reviewed: 11/02/2010 Elsevier Interactive Patient Education [employer]

## 2015-04-17 ENCOUNTER — Encounter (HOSPITAL_COMMUNITY): Payer: Self-pay | Admitting: Emergency Medicine

## 2015-04-17 ENCOUNTER — Emergency Department (HOSPITAL_COMMUNITY)
Admission: EM | Admit: 2015-04-17 | Discharge: 2015-04-17 | Disposition: A | Discharge status: Home / Self Care | Payer: BLUE CROSS/BLUE SHIELD | Attending: Emergency Medicine | Admitting: Emergency Medicine

## 2015-04-17 DIAGNOSIS — Z8719 Personal history of other diseases of the digestive system: Secondary | ICD-10-CM | POA: Diagnosis not present

## 2015-04-17 DIAGNOSIS — M542 Cervicalgia: Secondary | ICD-10-CM | POA: Insufficient documentation

## 2015-04-17 DIAGNOSIS — Z791 Long term (current) use of non-steroidal anti-inflammatories (NSAID): Secondary | ICD-10-CM | POA: Insufficient documentation

## 2015-04-17 MED ORDER — NAPROXEN 250 MG PO TABS: 250.0000 mg | ORAL_TABLET | Freq: Two times a day (BID) | ORAL | Status: DC

## 2015-04-17 MED ORDER — ACETAMINOPHEN 325 MG PO TABS
650.0000 mg | ORAL_TABLET | Freq: Once | ORAL | Status: AC
  Administered 2015-04-17: 650 mg via ORAL
  Filled 2015-04-17: qty 2

## 2015-04-17 MED ORDER — METHOCARBAMOL 500 MG PO TABS: 500.0000 mg | ORAL_TABLET | Freq: Two times a day (BID) | ORAL | Status: DC | PRN

## 2015-04-17 NOTE — ED Notes (Signed)
Pt states that he woke up yesterday with R sided neck pain. Can move neck but it hurts. Alert and oriented.

## 2015-04-17 NOTE — ED Provider Notes (Signed)
CSN: [id number]     Arrival date & time 04/17/15  0040 History   First MD Initiated Contact with Patient 04/17/15 0058     Chief Complaint  Patient presents with  . Neck Pain   Shaun Steele is a 55 y.o. male who presents to the emergency department complaining of bilateral lateral neck pain for the past 2 days. He currently complains of 10 out of 10 pain to his bilateral neck. He reports his pain is worse with movement. He has attempted using icy hot for treatment without relief. She denies any injury or trauma to his neck. He reports he sits at a desk with his arms up high in the air for work and believes this might contribute to his neck pain. He denies fevers, headache, numbness, tingling, weakness, sore throat, trouble swallowing, chest pain, or rashes.  (Consider location/radiation/quality/duration/timing/severity/associated sxs/prior Treatment) HPI  Past Medical History  Diagnosis Date  . Umbilical hernia    Past Surgical History  Procedure Laterality Date  . Hernia repair     History reviewed. No pertinent family history. Social History  Substance Use Topics  . Smoking status: Never Smoker   . Smokeless tobacco: None  . Alcohol Use: No    Review of Systems  Constitutional: Negative for fever and chills.  HENT: Negative for sore throat and trouble swallowing.   Eyes: Negative for visual disturbance.  Cardiovascular: Negative for chest pain.  Musculoskeletal: Positive for neck pain. Negative for back pain.  Skin: Negative for rash.  Neurological: Negative for weakness, light-headedness, numbness and headaches.      Allergies  Review of patient's allergies indicates no known allergies.  Home Medications   Prior to Admission medications   Medication Sig Start Date End Date Taking? Authorizing Provider  cyclobenzaprine (FLEXERIL) 10 MG tablet Take 1 tablet (10 mg total) by mouth 2 (two) times daily as needed for muscle spasms. 03/02/15   Danelle Berry, PA-C  ibuprofen  (ADVIL,MOTRIN) 800 MG tablet Take 1 tablet (800 mg total) by mouth 3 (three) times daily. 03/02/15   Danelle Berry, PA-C  meloxicam (MOBIC) 15 MG tablet Take 1 tablet (15 mg total) by mouth daily. Patient not taking: Reported on 06/29/2014 08/22/13   Daine Gip, MD  methocarbamol (ROBAXIN) 500 MG tablet Take 1 tablet (500 mg total) by mouth 2 (two) times daily as needed for muscle spasms. 04/17/15   Everlene Farrier, PA-C  naproxen (NAPROSYN) 250 MG tablet Take 1 tablet (250 mg total) by mouth 2 (two) times daily with a meal. 04/17/15   Everlene Farrier, PA-C  predniSONE (DELTASONE) 20 MG tablet Take 2 tablets (40 mg total) by mouth daily. Take 40 mg by mouth daily for 3 days, then  by mouth daily for 3 days, then  daily for 3 days 03/02/15   Danelle Berry, PA-C  traMADol (ULTRAM) 50 MG tablet Take 1 tablet (50 mg total) by mouth every 12 (twelve) hours as needed for severe pain. 03/02/15   Danelle Berry, PA-C   BP 126/85 mmHg  Pulse 81  Temp(Src) 97.5 F (36.4 C) (Oral)  Resp 18  SpO2 96% Physical Exam  Constitutional: He is oriented to person, place, and time. He appears well-developed and well-nourished. No distress.  Nontoxic appearing.  HENT:  Head: Normocephalic and atraumatic.  Right Ear: External ear normal.  Left Ear: External ear normal.  Mouth/Throat: Oropharynx is clear and moist.  Eyes: Conjunctivae are normal. Pupils are equal, round, and reactive to light. Right eye exhibits no  discharge. Left eye exhibits no discharge.  Neck: Neck supple. No JVD present. No tracheal deviation present.  Patient has tenderness along his bilateral trapezius muscles into his neck. No midline neck tenderness. He has good and full range of motion of his neck without difficulty. He is able to place his chin to his chest and look up to the ceiling. He is able to rotate his head greater than 45 in each direction. No meningeal signs.  Cardiovascular: Normal rate, regular rhythm, normal heart sounds and  intact distal pulses.  Exam reveals no gallop and no friction rub.   No murmur heard. Bilateral radial pulses are intact.  Pulmonary/Chest: Effort normal and breath sounds normal. No stridor. No respiratory distress. He has no wheezes. He has no rales.  Abdominal: Soft. There is no tenderness.  Musculoskeletal: He exhibits no edema.  Lymphadenopathy:    He has no cervical adenopathy.  Neurological: He is alert and oriented to person, place, and time. Coordination normal.  Sensation is intact his bilateral upper extremities.  Skin: Skin is warm and dry. No rash noted. He is not diaphoretic. No erythema. No pallor.  Psychiatric: He has a normal mood and affect. His behavior is normal.  Nursing note and vitals reviewed.   ED Course  Procedures (including critical care time) Labs Review Labs Reviewed - No data to display  Imaging Review No results found.    EKG Interpretation None      Filed Vitals:   04/17/15 0054  BP: 126/85  Pulse: 81  Temp: 97.5 F (36.4 C)  TempSrc: Oral  Resp: 18  SpO2: 96%     MDM   Meds given in ED:  Medications  acetaminophen (TYLENOL) tablet 650 mg (not administered)    New Prescriptions   METHOCARBAMOL (ROBAXIN) 500 MG TABLET    Take 1 tablet (500 mg total) by mouth 2 (two) times daily as needed for muscle spasms.   NAPROXEN (NAPROSYN) 250 MG TABLET    Take 1 tablet (250 mg total) by mouth 2 (two) times daily with a meal.    Final diagnoses:  Bilateral neck pain   This  is a 55 y.o. male who presents to the emergency department complaining of bilateral lateral neck pain for the past 2 days. He currently complains of 10 out of 10 pain to his bilateral neck. He reports his pain is worse with movement. He has attempted using icy hot for treatment without relief. She denies any injury or trauma to his neck. He reports he sits at a desk with his arms up high in the air for work and believes this might contribute to his neck pain. He denies  fevers, headache, numbness, tingling, weakness.  On exam the patient is afebrile and nontoxic appearing. No meningeal signs. Good range of motion of his neck. Patient has tenderness along his bilateral trapezius muscles. No midline neck tenderness. Patient with stiff neck. Will provide with prescriptions for Robaxin and naproxen. I encouraged him to follow-up closely with his primary care provider. I advised the patient to follow-up with their primary care provider this week. I advised the patient to return to the emergency department with new or worsening symptoms or new concerns. The patient verbalized understanding and agreement with plan.       Everlene Farrier, PA-C 04/17/15 0127  Cy Blamer, MD 04/17/15 [phone]

## 2015-04-17 NOTE — Discharge Instructions (Signed)
Acute Torticollis °Torticollis is a condition in which the muscles of the neck tighten (contract) abnormally, causing the neck to twist and the head to move into an unnatural position. Torticollis that develops suddenly is called acute torticollis. If torticollis becomes chronic and is left untreated, the face and neck can become deformed. °CAUSES °This condition may be caused by: °· Sleeping in an awkward position (common). °· Extending or twisting the neck muscles beyond their normal position. °· Infection. °In some cases, the cause may not be known. °SYMPTOMS °Symptoms of this condition include: °· An unnatural position of the head. °· Neck pain. °· A limited ability to move the neck. °· Twisting of the neck to one side. °DIAGNOSIS °This condition is diagnosed with a physical exam. You may also have imaging tests, such as an X-ray, CT scan, or MRI. °TREATMENT °Treatment for this condition involves trying to relax the neck muscles. It may include: °· Medicines or shots. °· Physical therapy. °· Surgery. This may be done in severe cases. °HOME CARE INSTRUCTIONS °· Take medicines only as directed by your health care provider. °· Do stretching exercises and massage your neck as directed by your health care provider. °· Keep all follow-up visits as directed by your health care provider. This is important. °SEEK MEDICAL CARE IF: °· You develop a fever. °SEEK IMMEDIATE MEDICAL CARE IF: °· You develop difficulty breathing. °· You develop noisy breathing (stridor). °· You start drooling. °· You have trouble swallowing or have pain with swallowing. °· You develop numbness or weakness in your hands or feet. °· You have changes in your speech, understanding, or vision. °· Your pain gets worse. °  °This information is not intended to replace advice given to you by your health care provider. Make sure you discuss any questions you have with your health care provider. °  °Document Released: 03/05/2000 Document Revised:  07/23/2014 Document Reviewed: 03/04/2014 °Elsevier Interactive Patient Education ©2016 [employer] ° °

## 2016-03-14 ENCOUNTER — Emergency Department (HOSPITAL_COMMUNITY)
Admission: EM | Admit: 2016-03-14 | Discharge: 2016-03-14 | Disposition: A | Discharge status: Home / Self Care | Payer: Managed Care, Other (non HMO) | Attending: Emergency Medicine | Admitting: Emergency Medicine

## 2016-03-14 ENCOUNTER — Encounter (HOSPITAL_COMMUNITY): Payer: Self-pay

## 2016-03-14 DIAGNOSIS — S39012A Strain of muscle, fascia and tendon of lower back, initial encounter: Secondary | ICD-10-CM | POA: Insufficient documentation

## 2016-03-14 DIAGNOSIS — Y999 Unspecified external cause status: Secondary | ICD-10-CM | POA: Insufficient documentation

## 2016-03-14 DIAGNOSIS — T148XXA Other injury of unspecified body region, initial encounter: Secondary | ICD-10-CM

## 2016-03-14 DIAGNOSIS — Y939 Activity, unspecified: Secondary | ICD-10-CM | POA: Insufficient documentation

## 2016-03-14 DIAGNOSIS — X58XXXA Exposure to other specified factors, initial encounter: Secondary | ICD-10-CM | POA: Insufficient documentation

## 2016-03-14 DIAGNOSIS — Y929 Unspecified place or not applicable: Secondary | ICD-10-CM | POA: Insufficient documentation

## 2016-03-14 MED ORDER — KETOROLAC TROMETHAMINE 60 MG/2ML IM SOLN
60.0000 mg | Freq: Once | INTRAMUSCULAR | Status: AC
  Administered 2016-03-14: 60 mg via INTRAMUSCULAR
  Filled 2016-03-14: qty 2

## 2016-03-14 MED ORDER — NAPROXEN 500 MG PO TABS: 500.0000 mg | ORAL_TABLET | Freq: Two times a day (BID) | ORAL | 0 refills | Status: DC

## 2016-03-14 MED ORDER — CYCLOBENZAPRINE HCL 10 MG PO TABS: 10.0000 mg | ORAL_TABLET | Freq: Two times a day (BID) | ORAL | 0 refills | Status: DC | PRN

## 2016-03-14 NOTE — Discharge Instructions (Signed)
Medications: Flexeril, Naprosyn  Treatment: Take Flexeril as prescribed twice daily as needed for muscle pain and spasms. Do not drive or operate machinery when taking this medication. Take Naprosyn twice for your pain. Use ice and heat alternating 20 minutes on, 20 minutes off. Attempt the stretches attached as tolerated 1-2 times daily.  Follow-up: Please follow-up and establish care with a primary care provider. Please follow-up with them or return to emergency department if your symptoms are not improving over the next 1-2 weeks. Please return to emergency department if you develop any new or worsening symptoms

## 2016-03-14 NOTE — ED Provider Notes (Signed)
[facility]-EMERGENCY DEPT Provider Note   CSN: [id number] Arrival date & time: 03/14/16  1022  By signing my name below, I, Rosario Adie, attest that this documentation has been prepared under the direction and in the presence of Buel Ream, PA-C.  Electronically Signed: Rosario Adie, ED Scribe. 03/14/16. 10:39 AM.  History   Chief Complaint Chief Complaint  Patient presents with  . Back Pain   The history is provided by the patient. No language interpreter was used.    HPI Comments: Shaun Steele is a 55 y.o. male with no pertinent PMHx, who presents to the Emergency Department complaining of persistent left-sided, lower and mid back pain onset yesterday. He describes his pain as sharp and tight. Pt reports that he took a three hour car ride yesterday during which he slept on his right side for an extended period of time. He states that his pain has been present since waking up from this. No h/o prior back issues/injuries and no recent trauma to the back otherwsie. He has applied [employer] creme to the area and took 800mg  Ibuprofen at home without relief of his pain. His pain is exacerbated with bending forwards, laterally, or backwards. No h/o cancer or IVDU. He denies saddle anaesthesia/paraesthesias, focal weakness/numbness, bowel/bladder incontinence, fever, chest pain, abdominal pain, nausea, vomiting, neck pain, urgency, frequency, hematuria, dysuria, difficulty urinating, or any other associated symptoms.   Past Medical History:  Diagnosis Date  . Umbilical hernia    Patient Active Problem List   Diagnosis Date Noted  . Arthritis of right knee 08/22/2013  . Left groin pain 05/22/2013  . Inguinal hernia, left. 04/17/2013   Past Surgical History:  Procedure Laterality Date  . HERNIA REPAIR      Home Medications    Prior to Admission medications   Medication Sig Start Date End Date Taking? Authorizing Provider  cyclobenzaprine (FLEXERIL) 10 MG tablet Take 1  tablet (10 mg total) by mouth 2 (two) times daily as needed for muscle spasms. 03/14/16   Emi Holes, PA-C  ibuprofen (ADVIL,MOTRIN) 800 MG tablet Take 1 tablet (800 mg total) by mouth 3 (three) times daily. 03/02/15   Danelle Berry, PA-C  meloxicam (MOBIC) 15 MG tablet Take 1 tablet (15 mg total) by mouth daily. Patient not taking: Reported on 06/29/2014 08/22/13   Daine Gip, MD  methocarbamol (ROBAXIN) 500 MG tablet Take 1 tablet (500 mg total) by mouth 2 (two) times daily as needed for muscle spasms. 04/17/15   Everlene Farrier, PA-C  naproxen (NAPROSYN) 500 MG tablet Take 1 tablet (500 mg total) by mouth 2 (two) times daily. 03/14/16   Emi Holes, PA-C  predniSONE (DELTASONE) 20 MG tablet Take 2 tablets (40 mg total) by mouth daily. Take 40 mg by mouth daily for 3 days, then 20mg  by mouth daily for 3 days, then 10mg  daily for 3 days 03/02/15   Danelle Berry, PA-C  traMADol (ULTRAM) 50 MG tablet Take 1 tablet (50 mg total) by mouth every 12 (twelve) hours as needed for severe pain. 03/02/15   Danelle Berry, PA-C   Family History History reviewed. No pertinent family history.  Social History Social History  Substance Use Topics  . Smoking status: Never Smoker  . Smokeless tobacco: Never Used  . Alcohol use No   Allergies   Patient has no known allergies.  Review of Systems Review of Systems  Constitutional: Negative for chills and fever.  HENT: Negative for facial swelling and sore throat.  Respiratory: Negative for shortness of breath.   Cardiovascular: Negative for chest pain.  Gastrointestinal: Negative for abdominal pain, nausea and vomiting.  Genitourinary: Negative for difficulty urinating, dysuria, frequency, hematuria and urgency.  Musculoskeletal: Positive for back pain and myalgias. Negative for neck pain.  Skin: Negative for rash and wound.  Neurological: Negative for weakness, numbness and headaches.       Negative for saddle anaesthesia/paraesthesias. Negative for  bowel/bladder incontinence.   Psychiatric/Behavioral: The patient is not nervous/anxious.    Physical Exam Updated Vital Signs BP 133/86 (BP Location: Right Arm)   Pulse 78   Temp 98.3 F (36.8 C) (Oral)   Resp 14   Ht 6\' 5"  (1.956 m)   Wt 95.3 kg   SpO2 99%   BMI 24.90 kg/m   Physical Exam  Constitutional: He appears well-developed and well-nourished. No distress.  HENT:  Head: Normocephalic and atraumatic.  Mouth/Throat: Oropharynx is clear and moist. No oropharyngeal exudate.  Eyes: Conjunctivae are normal. Pupils are equal, round, and reactive to light. Right eye exhibits no discharge. Left eye exhibits no discharge. No scleral icterus.  Neck: Normal range of motion. Neck supple. No thyromegaly present.  Cardiovascular: Normal rate, regular rhythm, normal heart sounds and intact distal pulses.  Exam reveals no gallop and no friction rub.   No murmur heard. Pulmonary/Chest: Effort normal and breath sounds normal. No stridor. No respiratory distress. He has no wheezes. He has no rales.  Abdominal: Soft. Bowel sounds are normal. He exhibits no distension. There is no tenderness. There is no rebound and no guarding.  Musculoskeletal: He exhibits tenderness. He exhibits no edema.  Left thoracic and lumbar paraspinal tenderness. No CTL midline spinal tenderness. Pain worsened with flexion, extension, or lateral flexion.   Lymphadenopathy:    He has no cervical adenopathy.  Neurological: He is alert. Coordination normal.  Reflex Scores:      Patellar reflexes are 2+ on the right side and 2+ on the left side. Normal sensation throughout; 5/5 strength to lower extremities  Skin: Skin is warm and dry. No rash noted. He is not diaphoretic. No pallor.  Psychiatric: He has a normal mood and affect.  Nursing note and vitals reviewed.  ED Treatments / Results  DIAGNOSTIC STUDIES: Oxygen Saturation is 99% on RA, normal by my interpretation.   COORDINATION OF CARE: 10:36 AM-Discussed  next steps with pt. Pt verbalized understanding and is agreeable with the plan.   Procedures Procedures   Medications Ordered in ED Medications  ketorolac (TORADOL) injection 60 mg (60 mg Intramuscular Given 03/14/16 1059)   Initial Impression / Assessment and Plan / ED Course  I have reviewed the triage vital signs and the nursing notes.  Clinical Course     Patient is a 55yo male with no pertinent PMHx, who presents to the ED with back pain. No neurological deficits appreciated. Patient is ambulatory. No warning symptoms of back pain including: fecal incontinence, urinary retention or overflow incontinence, night sweats, waking from sleep with back pain, unexplained fevers or weight loss, h/o cancer, IVDU, recent trauma. No concern for cauda equina, epidural abscess, or other serious cause of back pain. Will give Toradol injection while in the ED and discharge home with muscle relaxer and Naprosyn. Patient also encouraged to stretch and do back exercises which we discussed. Conservative home measures such as rest, ice/heat and pain medicine indicated with PCP follow-up if no improvement with conservative management. Pt is comfortable with above plan and is stable for discharge at  this time. All questions were answered prior to disposition. Strict return precautions for return into the ED were discussed.   Final Clinical Impressions(s) / ED Diagnoses   Final diagnoses:  Muscle strain   New Prescriptions New Prescriptions   CYCLOBENZAPRINE (FLEXERIL) 10 MG TABLET    Take 1 tablet (10 mg total) by mouth 2 (two) times daily as needed for muscle spasms.   NAPROXEN (NAPROSYN) 500 MG TABLET    Take 1 tablet (500 mg total) by mouth 2 (two) times daily.   I personally performed the services described in this documentation, which was scribed in my presence. The recorded information has been reviewed and is accurate.       Emi Holeslexandra M Ebbie Sorenson, PA-C 03/14/16 1115    Alvira MondayErin Schlossman,  MD 03/15/16 [phone]

## 2016-03-14 NOTE — ED Triage Notes (Signed)
Pt c/o L lower back pain after "leaning to the side for around 45 minutes" yesterday.  Pain score 10/10.  Pt reports taking OTC pain medications w/o relief.  Pt ambulated to room w/o difficulty.

## 2016-06-22 ENCOUNTER — Emergency Department (HOSPITAL_COMMUNITY): Payer: Managed Care, Other (non HMO)

## 2016-06-22 ENCOUNTER — Encounter (HOSPITAL_COMMUNITY): Payer: Self-pay | Admitting: Emergency Medicine

## 2016-06-22 ENCOUNTER — Emergency Department (HOSPITAL_COMMUNITY)
Admission: EM | Admit: 2016-06-22 | Discharge: 2016-06-22 | Disposition: A | Discharge status: Home / Self Care | Payer: Managed Care, Other (non HMO) | Attending: Emergency Medicine | Admitting: Emergency Medicine

## 2016-06-22 DIAGNOSIS — R102 Pelvic and perineal pain: Secondary | ICD-10-CM

## 2016-06-22 DIAGNOSIS — R52 Pain, unspecified: Secondary | ICD-10-CM

## 2016-06-22 DIAGNOSIS — R1030 Lower abdominal pain, unspecified: Secondary | ICD-10-CM | POA: Insufficient documentation

## 2016-06-22 LAB — CBC
HCT: 40.7 % (ref 39.0–52.0)
Hemoglobin: 13.8 g/dL (ref 13.0–17.0)
MCH: 29.4 pg (ref 26.0–34.0)
MCHC: 33.9 g/dL (ref 30.0–36.0)
MCV: 86.6 fL (ref 78.0–100.0)
Platelets: 279 10*3/uL (ref 150–400)
RBC: 4.7 MIL/uL (ref 4.22–5.81)
RDW: 12.7 % (ref 11.5–15.5)
WBC: 4 10*3/uL (ref 4.0–10.5)

## 2016-06-22 LAB — COMPREHENSIVE METABOLIC PANEL
ALBUMIN: 4 g/dL (ref 3.5–5.0)
ALK PHOS: 63 U/L (ref 38–126)
ALT: 20 U/L (ref 17–63)
AST: 19 U/L (ref 15–41)
Anion gap: 5 (ref 5–15)
BILIRUBIN TOTAL: 1 mg/dL (ref 0.3–1.2)
BUN: 13 mg/dL (ref 6–20)
CALCIUM: 9.5 mg/dL (ref 8.9–10.3)
CO2: 28 mmol/L (ref 22–32)
CREATININE: 0.74 mg/dL (ref 0.61–1.24)
Chloride: 103 mmol/L (ref 101–111)
GFR calc Af Amer: >60 mL/min (ref >60)
GFR calc non Af Amer: >60 mL/min (ref >60)
GLUCOSE: 109 mg/dL — AB (ref 65–99)
Potassium: 4.1 mmol/L (ref 3.5–5.1)
Sodium: 136 mmol/L (ref 135–145)
TOTAL PROTEIN: 7.3 g/dL (ref 6.5–8.1)

## 2016-06-22 LAB — URINALYSIS, ROUTINE W REFLEX MICROSCOPIC
Bilirubin Urine: NEGATIVE
Glucose, UA: NEGATIVE mg/dL
Hgb urine dipstick: NEGATIVE
KETONES UR: NEGATIVE mg/dL
Leukocytes, UA: NEGATIVE
NITRITE: NEGATIVE
PH: 7 (ref 5.0–8.0)
Protein, ur: NEGATIVE mg/dL
Specific Gravity, Urine: 1.004 — ABNORMAL LOW (ref 1.005–1.030)

## 2016-06-22 LAB — LIPASE, BLOOD: Lipase: 28 U/L (ref 11–51)

## 2016-06-22 MED ORDER — OXYCODONE-ACETAMINOPHEN 5-325 MG PO TABS
1.0000 | ORAL_TABLET | Freq: Once | ORAL | Status: AC
  Administered 2016-06-22: 1 via ORAL
  Filled 2016-06-22: qty 1

## 2016-06-22 MED ORDER — OXYCODONE-ACETAMINOPHEN 5-325 MG PO TABS: 1.0000 | ORAL_TABLET | ORAL | 0 refills | Status: DC | PRN

## 2016-06-22 NOTE — ED Triage Notes (Signed)
Pt reports he had bilateral lower abd pain yesterday that went away. Pain in LLQ began again 2 hours ago. No n/v/d. Never had generalized abd pain. No urinary symptoms. Hx of hernia surgery.

## 2016-06-22 NOTE — ED Provider Notes (Signed)
[facility]-EMERGENCY DEPT Provider Note   CSN: [id number] Arrival date & time: 06/22/16  1355     History   Chief Complaint Chief Complaint  Patient presents with  . Abdominal Pain    HPI Shaun Steele is a 56 y.o. male.  Patient presents with complaint of lower midline abdominal pain for the past 1 day, initially intermittent and now constant. No nausea, fever, vomiting, dysuria. The pain is not affected by urinating or bowel movement. He has had a left inguinal hernia repair 2 years ago and reports current symptoms are not similar. No testicular pain or scrotal swelling. No change in bowel movements, constipation or melena.    The history is provided by the patient. No language interpreter was used.  Abdominal Pain   Pertinent negatives include fever, diarrhea, nausea, vomiting, constipation, dysuria, frequency and hematuria.    Past Medical History:  Diagnosis Date  . Umbilical hernia     Patient Active Problem List   Diagnosis Date Noted  . Arthritis of right knee 08/22/2013  . Left groin pain 05/22/2013  . Inguinal hernia, left. 04/17/2013    Past Surgical History:  Procedure Laterality Date  . HERNIA REPAIR         Home Medications    Prior to Admission medications   Medication Sig Start Date End Date Taking? Authorizing Provider  cyclobenzaprine (FLEXERIL) 10 MG tablet Take 1 tablet (10 mg total) by mouth 2 (two) times daily as needed for muscle spasms. 03/14/16   Emi Holes, PA-C  ibuprofen (ADVIL,MOTRIN) 800 MG tablet Take 1 tablet (800 mg total) by mouth 3 (three) times daily. 03/02/15   Danelle Berry, PA-C  meloxicam (MOBIC) 15 MG tablet Take 1 tablet (15 mg total) by mouth daily. Patient not taking: Reported on 06/29/2014 08/22/13   Daine Gip, MD  methocarbamol (ROBAXIN) 500 MG tablet Take 1 tablet (500 mg total) by mouth 2 (two) times daily as needed for muscle spasms. 04/17/15   Everlene Farrier, PA-C  naproxen (NAPROSYN) 500 MG tablet Take 1 tablet (500 mg  total) by mouth 2 (two) times daily. 03/14/16   Emi Holes, PA-C  predniSONE (DELTASONE) 20 MG tablet Take 2 tablets (40 mg total) by mouth daily. Take 40 mg by mouth daily for 3 days, then  by mouth daily for 3 days, then  daily for 3 days 03/02/15   Danelle Berry, PA-C  traMADol (ULTRAM) 50 MG tablet Take 1 tablet (50 mg total) by mouth every 12 (twelve) hours as needed for severe pain. 03/02/15   Danelle Berry, PA-C    Family History History reviewed. No pertinent family history.  Social History Social History  Substance Use Topics  . Smoking status: Never Smoker  . Smokeless tobacco: Never Used  . Alcohol use No     Allergies   Patient has no known allergies.   Review of Systems Review of Systems  Constitutional: Negative for chills and fever.  HENT: Negative.   Respiratory: Negative.   Cardiovascular: Negative.   Gastrointestinal: Positive for abdominal pain (limited to suprapubic area.). Negative for blood in stool, constipation, diarrhea, nausea and vomiting.  Genitourinary: Negative.  Negative for dysuria, frequency, hematuria, scrotal swelling and testicular pain.  Musculoskeletal: Negative.  Negative for back pain.  Skin: Negative.   Neurological: Negative.      Physical Exam Updated Vital Signs BP (!) 141/93 (BP Location: Left Arm)   Pulse 70   Temp 97.8 F (36.6 C) (Oral)   Resp 16  Ht  (1.905 m)   Wt 105.5 kg   SpO2 98%   BMI 29.06 kg/m   Physical Exam  Constitutional: He appears well-developed and well-nourished.  HENT:  Head: Normocephalic.  Neck: Normal range of motion. Neck supple.  Cardiovascular: Normal rate.   Pulmonary/Chest: Effort normal.  Abdominal: Soft. Bowel sounds are normal. There is tenderness. There is no rebound and no guarding.    Genitourinary:  Genitourinary Comments: Circumcised penis. No groin mass. There is bilateral testicular tenderness without mass or swelling that extends to base of penis. No hernia  appreciated.  Musculoskeletal: Normal range of motion.  Neurological: He is alert. No cranial nerve deficit.  Skin: Skin is warm and dry. No rash noted.  Psychiatric: He has a normal mood and affect.     ED Treatments / Results  Labs (all labs ordered are listed, but only abnormal results are displayed) Labs Reviewed  LIPASE, BLOOD  COMPREHENSIVE METABOLIC PANEL  CBC  URINALYSIS, ROUTINE W REFLEX MICROSCOPIC    EKG  EKG Interpretation None       Radiology No results found.  Procedures Procedures (including critical care time)  Medications Ordered in ED Medications - No data to display   Initial Impression / Assessment and Plan / ED Course  I have reviewed the triage vital signs and the nursing notes.  Pertinent labs & imaging results that were available during my care of the patient were reviewed by me and considered in my medical decision making (see chart for details).     Pain is improved with medication. Labs are essentially without abnormality to explain patient's pain. US scrotum normal. No hernia, inflammation, mass or torsion.  Patient can be discharged home and should follow up with PCP for recheck and further evaluation.  Final Clinical Impressions(s) / ED Diagnoses   Final diagnoses:  None   1. Suprapubic abdominal pain  New Prescriptions New Prescriptions   No medications on file     Elpidio Anis, Cordelia Poche 06/22/16 1732    Doug Sou, MD 06/23/16 [phone]

## 2016-06-22 NOTE — ED Provider Notes (Signed)
Complains of suprapubic pain that yesterday nonradiating feels improved today after he urinated here. No nausea no vomiting no fever. No pain with bowel movements. On exam no distress abdomen nondistended mildly tender at suprapubic area, nondistended and attained normal male. Scrotum nontender. Rectal normal tone nontender no gross blood   Doug Sou, MD 06/23/16 0001

## 2016-06-22 NOTE — ED Notes (Signed)
Pt ambulatory and independent at discharge.  Verbalized understanding of discharge instructions 

## 2016-12-23 ENCOUNTER — Encounter (HOSPITAL_COMMUNITY): Payer: Self-pay | Admitting: Emergency Medicine

## 2016-12-23 DIAGNOSIS — Z5321 Procedure and treatment not carried out due to patient leaving prior to being seen by health care provider: Secondary | ICD-10-CM | POA: Insufficient documentation

## 2016-12-23 DIAGNOSIS — R300 Dysuria: Secondary | ICD-10-CM | POA: Insufficient documentation

## 2016-12-23 NOTE — ED Notes (Signed)
Called  No response from lobby 

## 2016-12-23 NOTE — ED Triage Notes (Signed)
Pt complaint of lower back pain, dysuria and frequency with urination.

## 2016-12-24 ENCOUNTER — Emergency Department (HOSPITAL_COMMUNITY)
Admission: EM | Admit: 2016-12-24 | Discharge: 2016-12-24 | Disposition: A | Discharge status: Home / Self Care | Payer: Managed Care, Other (non HMO) | Attending: Emergency Medicine | Admitting: Emergency Medicine

## 2016-12-24 ENCOUNTER — Emergency Department (HOSPITAL_COMMUNITY): Payer: Managed Care, Other (non HMO)

## 2016-12-24 ENCOUNTER — Encounter (HOSPITAL_COMMUNITY): Payer: Self-pay | Admitting: Emergency Medicine

## 2016-12-24 DIAGNOSIS — R109 Unspecified abdominal pain: Secondary | ICD-10-CM

## 2016-12-24 DIAGNOSIS — R300 Dysuria: Secondary | ICD-10-CM | POA: Insufficient documentation

## 2016-12-24 LAB — COMPREHENSIVE METABOLIC PANEL
ALK PHOS: 62 U/L (ref 38–126)
ALT: 22 U/L (ref 17–63)
AST: 24 U/L (ref 15–41)
Albumin: 4.1 g/dL (ref 3.5–5.0)
Anion gap: 8 (ref 5–15)
BUN: 17 mg/dL (ref 6–20)
CHLORIDE: 104 mmol/L (ref 101–111)
CO2: 25 mmol/L (ref 22–32)
CREATININE: 0.81 mg/dL (ref 0.61–1.24)
Calcium: 9.3 mg/dL (ref 8.9–10.3)
GFR calc Af Amer: >60 mL/min (ref >60)
Glucose, Bld: 119 mg/dL — ABNORMAL HIGH (ref 65–99)
Potassium: 3.8 mmol/L (ref 3.5–5.1)
Sodium: 137 mmol/L (ref 135–145)
Total Bilirubin: 0.7 mg/dL (ref 0.3–1.2)
Total Protein: 7.1 g/dL (ref 6.5–8.1)

## 2016-12-24 LAB — CBC
HCT: 37.7 % — ABNORMAL LOW (ref 39.0–52.0)
Hemoglobin: 13 g/dL (ref 13.0–17.0)
MCH: 29.9 pg (ref 26.0–34.0)
MCHC: 34.5 g/dL (ref 30.0–36.0)
MCV: 86.7 fL (ref 78.0–100.0)
PLATELETS: 235 10*3/uL (ref 150–400)
RBC: 4.35 MIL/uL (ref 4.22–5.81)
RDW: 12.6 % (ref 11.5–15.5)
WBC: 5.4 10*3/uL (ref 4.0–10.5)

## 2016-12-24 LAB — URINALYSIS, ROUTINE W REFLEX MICROSCOPIC
Bacteria, UA: NONE SEEN
Bilirubin Urine: NEGATIVE
GLUCOSE, UA: NEGATIVE mg/dL
Ketones, ur: NEGATIVE mg/dL
LEUKOCYTES UA: NEGATIVE
Nitrite: NEGATIVE
PH: 5 (ref 5.0–8.0)
Protein, ur: NEGATIVE mg/dL
SQUAMOUS EPITHELIAL / LPF: NONE SEEN
Specific Gravity, Urine: 1.016 (ref 1.005–1.030)
WBC, UA: NONE SEEN WBC/hpf (ref 0–5)

## 2016-12-24 LAB — LIPASE, BLOOD: LIPASE: 36 U/L (ref 11–51)

## 2016-12-24 MED ORDER — OXYCODONE-ACETAMINOPHEN 5-325 MG PO TABS
1.0000 | ORAL_TABLET | Freq: Once | ORAL | Status: AC
  Administered 2016-12-24: 1 via ORAL
  Filled 2016-12-24: qty 1

## 2016-12-24 MED ORDER — IBUPROFEN 200 MG PO TABS: 400.0000 mg | ORAL_TABLET | Freq: Four times a day (QID) | ORAL | 0 refills | Status: DC | PRN

## 2016-12-24 NOTE — ED Triage Notes (Signed)
Pt reports L flank pain radiating to suprapubic area for the past few days. Does have dysuria.

## 2016-12-24 NOTE — ED Notes (Signed)
Called  No response from lobby 

## 2016-12-24 NOTE — ED Provider Notes (Signed)
Emergency Department Provider Note   I have reviewed the triage vital signs and the nursing notes.   HISTORY  Chief Complaint Flank Pain   HPI Qadir Folks is a 56 y.o. male with a past history of hernia but no other medical problems presents to the emergency department today with 2 days of dysuria. Patient states that it also now has left sided flank pain. Does not endorse fever, nausea, vomiting. He just has a burning sensation when he urinates.No rashes, constipation or diarrhea. No other associated or modifying symptoms. No history of the same except for when he had a hernia. No history of kidney stones. Has not tried anything for symptoms.    Past Medical History:  Diagnosis Date  . Umbilical hernia     Patient Active Problem List   Diagnosis Date Noted  . Arthritis of right knee 08/22/2013  . Left groin pain 05/22/2013  . Inguinal hernia, left. 04/17/2013    Past Surgical History:  Procedure Laterality Date  . HERNIA REPAIR      Current Outpatient Rx  . Order #: [id number] Class: Print    Allergies Patient has no known allergies.  History reviewed. No pertinent family history.  Social History Social History  Substance Use Topics  . Smoking status: Never Smoker  . Smokeless tobacco: Never Used  . Alcohol use No    Review of Systems  All other systems negative except as documented in the HPI. All pertinent positives and negatives as reviewed in the HPI. ____________________________________________   PHYSICAL EXAM:  VITAL SIGNS: ED Triage Vitals  Enc Vitals Group     BP 12/24/16 1540 136/83     Pulse Rate 12/24/16 1540 79     Resp 12/24/16 1540 17     Temp 12/24/16 1540 99 F (37.2 C)     Temp Source 12/24/16 1540 Oral     SpO2 12/24/16 1540 95 %     Weight 12/24/16 1540 238 lb (108 kg)     Height 12/24/16 1540  (1.956 m)     Head Circumference --      Peak Flow --      Pain Score 12/24/16 1630 8     Pain Loc --      Pain Edu? --        Excl. in GC? --     Constitutional: Alert and oriented. Well appearing and in no acute distress. Eyes: Conjunctivae are normal. PERRL. EOMI. Head: Atraumatic. Nose: No congestion/rhinnorhea. Mouth/Throat: Mucous membranes are moist.  Oropharynx non-erythematous. Neck: No stridor.  No meningeal signs.   Cardiovascular: Normal rate, regular rhythm. Good peripheral circulation. Grossly normal heart sounds.   Respiratory: Normal respiratory effort.  No retractions. Lungs CTAB. Gastrointestinal: Soft and nontender. No distention.  Musculoskeletal: No lower extremity tenderness nor edema. No gross deformities of extremities. Neurologic:  Normal speech and language. No gross focal neurologic deficits are appreciated.  Skin:  Skin is warm, dry and intact. No rash noted.   ____________________________________________   LABS (all labs ordered are listed, but only abnormal results are displayed)  Labs Reviewed  COMPREHENSIVE METABOLIC PANEL - Abnormal; Notable for the following:       Result Value   Glucose, Bld 119 (*)    All other components within normal limits  CBC - Abnormal; Notable for the following:    HCT 37.7 (*)    All other components within normal limits  URINALYSIS, ROUTINE W REFLEX MICROSCOPIC - Abnormal; Notable for the following:  Hgb urine dipstick SMALL (*)    All other components within normal limits  LIPASE, BLOOD   ____________________________________________  RADIOLOGY  Ct Renal Stone Study  Result Date: 12/24/2016 CLINICAL DATA:  Low back pain.  Bilateral groin pain for 2 days. EXAM: CT ABDOMEN AND PELVIS WITHOUT CONTRAST TECHNIQUE: Multidetector CT imaging of the abdomen and pelvis was performed following the standard protocol without IV contrast. COMPARISON:  None. FINDINGS: Lower chest: The lung bases are clear without focal nodule, mass, or airspace disease. Heart size is normal. No significant pleural or pericardial effusion is present. Hepatobiliary:  A low-density lesion in the right lobe of the liver near the dome measures 3.5 x 3.2 x 3.0 cm. No other discrete lesions are present. The common bile duct is within normal limits. Calcified stones are present near the neck of the gallbladder measuring up to 5 mm. There are no inflammatory changes to suggest cholecystitis. Pancreas: Unremarkable. No pancreatic ductal dilatation or surrounding inflammatory changes. Spleen: Normal in size without focal abnormality. Adrenals/Urinary Tract: The adrenal glands are normal bilaterally. The kidneys and ureters are within normal limits bilaterally. There is no stone or obstruction. The urinary bladder is unremarkable. Stomach/Bowel: Stomach and duodenum are within normal limits bilaterally. The small bowel is unremarkable. The appendix is visualized and normal. The ascending and transverse colon are within normal limits. The descending and sigmoid colon are unremarkable. Vascular/Lymphatic: No significant vascular findings are present. No enlarged abdominal or pelvic lymph nodes. Reproductive: Prostate is unremarkable. Other: Fat herniates into the right inguinal canal without bowel. A paraumbilical hernia contains fat. The ventral wall defect is 23 mm. There is some stranding of fat within the hernia. A mesh repair is present in the left lower quadrant. Musculoskeletal: Vertebral body heights and alignment are maintained. Facet degenerative changes are most evident at L4-5 and L5-S1. The bony pelvis is intact. The hips are located and within normal limits. IMPRESSION: 1. No significant nephrolithiasis or urinary tract obstruction. 2. 3.5 x 3.2 x 3.0 cm low-density lesion near the dome of the liver. This is likely benign. When the patient is clinically stable and able to follow directions and hold their breath (preferably as an outpatient) further evaluation with dedicated abdominal MRI should be considered. 3. Cholelithiasis without evidence for cholecystitis. 4.  Paraumbilical hernia contains fat without bowel. There is some stranding of the fat which may reflect inflammation. 5. Right inguinal hernia contains fat without bowel. 6. Previous left inguinal hernia repair. 7. No other acute or focal abnormality to explain the patient's symptoms. Electronically Signed   By: Marin Roberts M.D.   On: 12/24/2016 19:50    ____________________________________________   PROCEDURES  Procedure(s) performed:   Procedures   ____________________________________________   INITIAL IMPRESSION / ASSESSMENT AND PLAN / ED COURSE  Pertinent labs & imaging results that were available during my care of the patient were reviewed by me and considered in my medical decision making (see chart for details).  Will eval for kidney stone. No evidence of infection. Pain medication pending CT scan.  CT ok. Patient states he has a history of hemangioma of liver from when he lived in [place], will get PCP and compare for changes and possible need for MRI as recommended.   ____________________________________________  FINAL CLINICAL IMPRESSION(S) / ED DIAGNOSES  Final diagnoses:  Flank pain    MEDICATIONS GIVEN DURING THIS VISIT:  Medications  oxyCODONE-acetaminophen (PERCOCET/ROXICET) 5-325 MG per tablet 1 tablet (1 tablet Oral Given 12/24/16 2004)  NEW OUTPATIENT MEDICATIONS STARTED DURING THIS VISIT:  Current Discharge Medication List      Note:  This document was prepared using Dragon voice recognition software and may include unintentional dictation errors.   Marily Memos, MD 12/24/16 2145

## 2016-12-24 NOTE — ED Notes (Signed)
DIDN'T ANSWER FOR [facility]

## 2017-01-05 ENCOUNTER — Ambulatory Visit: Payer: Managed Care, Other (non HMO) | Admitting: Family Medicine

## 2017-01-12 ENCOUNTER — Encounter: Payer: Self-pay | Admitting: Family Medicine

## 2017-01-12 ENCOUNTER — Ambulatory Visit (INDEPENDENT_AMBULATORY_CARE_PROVIDER_SITE_OTHER): Payer: Self-pay | Admitting: Family Medicine

## 2017-01-12 ENCOUNTER — Encounter: Payer: Self-pay | Admitting: *Deleted

## 2017-01-12 VITALS — BP 120/84 | HR 91 | Temp 98.8°F | Ht 74.0 in | Wt 237.2 lb

## 2017-01-12 DIAGNOSIS — K4090 Unilateral inguinal hernia, without obstruction or gangrene, not specified as recurrent: Secondary | ICD-10-CM

## 2017-01-12 DIAGNOSIS — M545 Low back pain, unspecified: Secondary | ICD-10-CM

## 2017-01-12 DIAGNOSIS — Z833 Family history of diabetes mellitus: Secondary | ICD-10-CM

## 2017-01-12 MED ORDER — CYCLOBENZAPRINE HCL 10 MG PO TABS: 10.0000 mg | ORAL_TABLET | Freq: Every day | ORAL | 0 refills | Status: DC

## 2017-01-12 NOTE — Progress Notes (Signed)
Subjective:     Patient ID: Shaun Steele, male   DOB: 02-15-1961, 56 y.o.   MRN: [id number]  HPI Patient seen to establish care. He's not had regular primary care follow-up. He states he's had some "arthritis "issues mostly in his low back. His main complaint is low back pain with current episode lasting about one week. He had intermittent flareups in the past. He had x-rays back in 2014 which showed mild degenerative changes. 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 Denies any injury. Current pain is dull achy pain which radiates bilaterally. No radiculitis symptoms. No weakness or numbness. Heat helps. Ibuprofen 800 mg did not help. Pain is worse during the day and less at night.  Patient recently went to ER with some dysuria and flank pain. He had CT scan which showed benign appearing liver lesion but patient states this was present back in 2011 [place] and diagnosed with hemangioma. He declines further evaluation at this time.   He had other findings incidentally which included gallstones, periumbilical hernia, and right inguinal hernia. He denies pain in any of these regions.Labs were reviewed and unremarkable  Currently takes no regular medications. Nonsmoker. Denies regular alcohol use. He is married with 3 children. Works with [employer] as above.  Family history significant for father, mother, and brother with type 2 diabetes  Past Medical History:  Diagnosis Date  . Arthritis   . Umbilical hernia    Past Surgical History:  Procedure Laterality Date  . HERNIA REPAIR      reports that he has never smoked. He has never used smokeless tobacco. He reports that he does not drink alcohol or use drugs. family history includes Diabetes in his brother, father, maternal grandfather, and mother. No Known Allergies   Review of Systems  Constitutional: Negative for fatigue.  Eyes: Negative for visual disturbance.  Respiratory: Negative for cough, chest tightness and  shortness of breath.   Cardiovascular: Negative for chest pain, palpitations and leg swelling.  Genitourinary: Negative for difficulty urinating, dysuria and frequency.  Musculoskeletal: Positive for back pain.  Neurological: Negative for dizziness, syncope, weakness, light-headedness, numbness and headaches.       Objective:   Physical Exam  Constitutional: He is oriented to person, place, and time. He appears well-developed and well-nourished. No distress.  Neck: No thyromegaly present.  Cardiovascular: Normal rate, regular rhythm and normal heart sounds.   No murmur heard. Pulmonary/Chest: Effort normal and breath sounds normal. No respiratory distress. He has no wheezes. He has no rales.  Musculoskeletal: He exhibits no edema.  Straight leg raise is negative. He has some mild muscular tenderness right and left lower lumbar region but no spinal tenderness.  Neurological: He is alert and oriented to person, place, and time. He has normal reflexes. No cranial nerve deficit.  Full-strength lower extremities. Symmetric reflexes.  Skin: No rash noted.       Assessment:     #1 somewhat chronic intermittent low back pain. Nonfocal neuro exam. Suspect musculoskeletal strain  #2 history of previous left inguinal hernia repair with recent CT scan showing paraumbilical hernia and right inguinal hernia which are asymptomatic  #3 benign appearing hepatic lesion on CT scan with recommendation to consider dedicated MRI abdomen. Patient states he had [place] lesion [place] and he refuses further workup at this time  #4 strong family history of type 2 diabetes. Recent nonfasting blood sugar 119    Plan:     -Reviewed  some extension back stretches -Continue heat as needed for symptomatic relief -Flexeril 10 mg daily at bedtime -Touch base in 2 weeks if not improved and consider physical therapy then -We've recommended he schedule complete physical. We did address things  like colonoscopy screening and PSA screening and is not interested in pursuing this point -We recommend follow-up for fasting blood sugar and A1c at some point later this year  Kristian Covey MD [facility]

## 2017-01-12 NOTE — Patient Instructions (Signed)
Low Back Sprain  A sprain is a stretch or tear in the bands of tissue that hold bones and joints together (ligaments). Sprains of the lower back (lumbar spine) are a common cause of low back pain. A sprain occurs when ligaments are overextended or stretched beyond their limits. The ligaments can become inflamed, resulting in pain and sudden muscle tightening (spasms). A sprain can be caused by an injury (trauma), or it can develop gradually due to overuse.  There are three types of sprains:  · Grade 1 is a mild sprain involving an overstretched ligament or a very slight tear of the ligament.  · Grade 2 is a moderate sprain involving a partial tear of the ligament.  · Grade 3 is a severe sprain involving a complete tear of the ligament.    What are the causes?  This condition may be caused by:  · Trauma, such as a fall or a hit to the body.  · Twisting or overstretching the back. This may result from doing activities that require a lot of energy, such as lifting heavy objects.    What increases the risk?  The following factors may increase your risk of getting this condition:  · Playing contact sports.  · Participating in sports or activities that put excessive stress on the back and require a lot of bending and twisting, including:  ? Lifting weights or heavy objects.  ? Gymnastics.  ? Soccer.  ? Figure skating.  ? Snowboarding.  · Being overweight or obese.  · Having poor strength and flexibility.    What are the signs or symptoms?  Symptoms of this condition may include:  · Sharp or dull pain in the lower back that does not go away. Pain may extend to the buttocks.  · Stiffness.  · Limited range of motion.  · Inability to stand up straight due to stiffness or pain.  · Muscle spasms.    How is this diagnosed?    This condition may be diagnosed based on:  · Your symptoms.  · Your medical history.  · A physical exam.  ? Your health care provider may push on certain areas of your back to determine the source of your  pain.  ? You may be asked to bend forward, backward, and side to side to assess the severity of your pain and your range of motion.  · Imaging tests, such as:  ? X-rays.  ? MRI.    How is this treated?  Treatment for this condition may include:  · Applying heat and cold to the affected area.  · Medicines to help relieve pain and to relax your muscles (muscle relaxants).  · NSAIDs to help reduce swelling and discomfort.  · Physical therapy.    When your symptoms improve, it is important to gradually return to your normal routine as soon as possible to reduce pain, avoid stiffness, and avoid loss of muscle strength. Generally, symptoms should improve within 6 weeks of treatment. However, recovery time varies.  Follow these instructions at home:  Managing pain, stiffness, and swelling  · If directed, apply ice to the injured area during the first 24 hours after your injury.  ? Put ice in a plastic bag.  ? Place a towel between your skin and the bag.  ? Leave the ice on for 20 minutes, 2-3 times a day.  · If directed, apply heat to the affected area as often as told by your health care provider. Use the   heat source that your health care provider recommends, such as a moist heat pack or a heating pad.  ? Place a towel between your skin and the heat source.  ? Leave the heat on for 20-30 minutes.  ? Remove the heat if your skin turns bright red. This is especially important if you are unable to feel pain, heat, or cold. You may have a greater risk of getting burned.  Activity  · Rest and return to your normal activities as told by your health care provider. Ask your health care provider what activities are safe for you.  · Avoid activities that take a lot of effort (are strenuous) for as long as told by your health care provider.  · Do exercises as told by your health care provider.  General instructions    · Take over-the-counter and prescription medicines only as told by your health care provider.  · If you have  questions or concerns about safety while taking pain medicine, talk with your health care provider.  · Do not drive or operate heavy machinery until you know how your pain medicine affects you.  · Do not use any tobacco products, such as cigarettes, chewing tobacco, and e-cigarettes. Tobacco can delay bone healing. If you need help quitting, ask your health care provider.  · Keep all follow-up visits as told by your health care provider. This is important.  How is this prevented?  · Warm up and stretch before being active.  · Cool down and stretch after being active.  · Give your body time to rest between periods of activity.  · Avoid:  ? Being physically inactive for long periods at a time.  ? Exercising or playing sports when you are tired or in pain.  · Use correct form when playing sports and lifting heavy objects.  · Use good posture when sitting and standing.  · Maintain a healthy weight.  · Sleep on a mattress with medium firmness to support your back.  · Make sure to use equipment that fits you, including shoes that fit well.  · Be safe and responsible while being active to avoid falls.  · Do at least 150 minutes of moderate-intensity exercise each week, such as brisk walking or water aerobics. Try a form of exercise that takes stress off your back, such as swimming or stationary cycling.  · Maintain physical fitness, including:  ? Strength. In particular, develop and maintain strong abdominal muscles.  ? Flexibility.  ? Cardiovascular fitness.  ? Endurance.  Contact a health care provider if:  · Your back pain does not improve after 6 weeks of treatment.  · Your symptoms get worse.  Get help right away if:  · Your back pain is severe.  · You are unable to stand or walk.  · You develop pain in your legs.  · You develop weakness in your buttocks or legs.  · You have difficulty controlling when you urinate or when you have a bowel movement.  This information is not intended to replace advice given to you by  your health care provider. Make sure you discuss any questions you have with your health care provider.  Document Released: 03/08/2005 Document Revised: 11/13/2015 Document Reviewed: 12/18/2014  Elsevier Interactive Patient Education © 2018 [employer]

## 2017-01-24 ENCOUNTER — Encounter: Payer: Self-pay | Admitting: Family Medicine

## 2017-01-24 ENCOUNTER — Ambulatory Visit (INDEPENDENT_AMBULATORY_CARE_PROVIDER_SITE_OTHER): Payer: Self-pay | Admitting: Family Medicine

## 2017-01-24 VITALS — BP 120/80 | HR 92 | Temp 98.6°F | Ht 74.0 in | Wt 240.6 lb

## 2017-01-24 DIAGNOSIS — M5432 Sciatica, left side: Secondary | ICD-10-CM

## 2017-01-24 MED ORDER — PREDNISONE 10 MG PO TABS: ORAL_TABLET | ORAL | 0 refills | Status: DC

## 2017-01-24 NOTE — Progress Notes (Signed)
Subjective:     Patient ID: Shaun Steele, male   DOB: 01/28/1961, 56 y.o.   MRN: [id number]  HPI Patient seen with some progressive left lumbar back pain now radiating down into left buttock and down toward the knee but not below the knee region. This somewhat of a dull achy pain occasionally sharp and worse with sitting. Somewhat improved with walking and lying supine. He tried Motrin 800 mg and muscle relaxer without any improvement whatsoever. No urine or stool incontinence. No dysuria. No fevers or chills. No recent injury reported.  As per previous note, he had some plain films back in 2014 which showed only mild degenerative changes. Not clear he has ever had MRI of lumbar spine.  Past Medical History:  Diagnosis Date  . Arthritis   . Umbilical hernia    Past Surgical History:  Procedure Laterality Date  . HERNIA REPAIR      reports that  has never smoked. he has never used smokeless tobacco. He reports that he does not drink alcohol or use drugs. family history includes Diabetes in his brother, father, maternal grandfather, and mother. No Known Allergies   Review of Systems  Constitutional: Negative for activity change, appetite change and fever.  Respiratory: Negative for cough and shortness of breath.   Cardiovascular: Negative for chest pain and leg swelling.  Gastrointestinal: Negative for abdominal pain and vomiting.  Genitourinary: Negative for dysuria, flank pain and hematuria.  Musculoskeletal: Positive for back pain. Negative for joint swelling.  Neurological: Negative for weakness and numbness.       Objective:   Physical Exam  Constitutional: He is oriented to person, place, and time. He appears well-developed and well-nourished. No distress.  Neck: No thyromegaly present.  Cardiovascular: Normal rate, regular rhythm and normal heart sounds.  No murmur heard. Pulmonary/Chest: Effort normal and breath sounds normal. No respiratory distress. He has no wheezes.  He has no rales.  Musculoskeletal: He exhibits no edema.  Straight leg raise is positive on the left  Neurological: He is alert and oriented to person, place, and time. He has normal reflexes. No cranial nerve deficit.  Full-strength lower extremities with symmetric reflexes. Normal sensory function to touch  Skin: No rash noted.       Assessment:     Progressive left lumbar back pain with sciatica type symptoms but nonfocal neuro exam    Plan:     -We recommend trial of prednisone taper with potential side effects reviewed -Touch base in one week if symptoms not improved -May need MRI for further assessment if no improvement in one week  Shaun CoveyBruce W Jonathyn Carothers MD [facility]

## 2017-01-24 NOTE — Patient Instructions (Signed)
Sciatica Sciatica is pain, numbness, weakness, or tingling along the path of the sciatic nerve. The sciatic nerve starts in the lower back and runs down the back of each leg. The nerve controls the muscles in the lower leg and in the back of the knee. It also provides feeling (sensation) to the back of the thigh, the lower leg, and the sole of the foot. Sciatica is a symptom of another medical condition that pinches or puts pressure on the sciatic nerve. Generally, sciatica only affects one side of the body. Sciatica usually goes away on its own or with treatment. In some cases, sciatica may keep coming back (recur). What are the causes? This condition is caused by pressure on the sciatic nerve, or pinching of the sciatic nerve. This may be the result of:  A disk in between the bones of the spine (vertebrae) bulging out too far (herniated disk).  Age-related changes in the spinal disks (degenerative disk disease).  A pain disorder that affects a muscle in the buttock (piriformis syndrome).  Extra bone growth (bone spur) near the sciatic nerve.  An injury or break (fracture) of the pelvis.  Pregnancy.  Tumor (rare). What increases the risk? The following factors may make you more likely to develop this condition:  Playing sports that place pressure or stress on the spine, such as football or weight lifting.  Having poor strength and flexibility.  A history of back injury.  A history of back surgery.  Sitting for long periods of time.  Doing activities that involve repetitive bending or lifting.  Obesity. What are the signs or symptoms? Symptoms can vary from mild to very severe, and they may include:  Any of these problems in the lower back, leg, hip, or buttock:  Mild tingling or dull aches.  Burning sensations.  Sharp pains.  Numbness in the back of the calf or the sole of the foot.  Leg weakness.  Severe back pain that makes movement difficult. These symptoms may  get worse when you cough, sneeze, or laugh, or when you sit or stand for long periods of time. Being overweight may also make symptoms worse. In some cases, symptoms may recur over time. How is this diagnosed? This condition may be diagnosed based on:  Your symptoms.  A physical exam. Your health care provider may ask you to do certain movements to check whether those movements trigger your symptoms.  You may have tests, including:  Blood tests.  X-rays.  MRI.  CT scan. How is this treated? In many cases, this condition improves on its own, without any treatment. However, treatment may include:  Reducing or modifying physical activity during periods of pain.  Exercising and stretching to strengthen your abdomen and improve the flexibility of your spine.  Icing and applying heat to the affected area.  Medicines that help:  To relieve pain and swelling.  To relax your muscles.  Injections of medicines that help to relieve pain, irritation, and inflammation around the sciatic nerve (steroids).  Surgery. Follow these instructions at home: Medicines   Take over-the-counter and prescription medicines only as told by your health care provider.  Do not drive or operate heavy machinery while taking prescription pain medicine. Managing pain   If directed, apply ice to the affected area.  Put ice in a plastic bag.  Place a towel between your skin and the bag.  Leave the ice on for 20 minutes, 2-3 times a day.  After icing, apply heat to the   heat to the affected area before you exercise or as often as told by your health care provider. Use the heat source that your health care provider recommends, such as a moist heat pack or a heating pad. ? Place a towel between your skin and the heat source. ? Leave the heat on for 20-30 minutes. ? Remove the heat if your skin turns bright red. This is especially important if you are unable to feel pain, heat, or cold. You may have a  greater risk of getting burned. Activity  Return to your normal activities as told by your health care provider. Ask your health care provider what activities are safe for you. ? Avoid activities that make your symptoms worse.  Take brief periods of rest throughout the day. Resting in a lying or standing position is usually better than sitting to rest. ? When you rest for longer periods, mix in some mild activity or stretching between periods of rest. This will help to prevent stiffness and pain. ? Avoid sitting for long periods of time without moving. Get up and move around at least one time each hour.  Exercise and stretch regularly, as told by your health care provider.  Do not lift anything that is heavier than 10 lb (4.5 kg) while you have symptoms of sciatica. When you do not have symptoms, you should still avoid heavy lifting, especially repetitive heavy lifting.  When you lift objects, always use proper lifting technique, which includes: ? Bending your knees. ? Keeping the load close to your body. ? Avoiding twisting. General instructions  Use good posture. ? Avoid leaning forward while sitting. ? Avoid hunching over while standing.  Maintain a healthy weight. Excess weight puts extra stress on your back and makes it difficult to maintain good posture.  Wear supportive, comfortable shoes. Avoid wearing high heels.  Avoid sleeping on a mattress that is too soft or too hard. A mattress that is firm enough to support your back when you sleep may help to reduce your pain.  Keep all follow-up visits as told by your health care provider. This is important. Contact a health care provider if:  You have pain that wakes you up when you are sleeping.  You have pain that gets worse when you lie down.  Your pain is worse than you have experienced in the past.  Your pain lasts longer than 4 weeks.  You experience unexplained weight loss. Get help right away if:  You lose control  of your bowel or bladder (incontinence).  You have: ? Weakness in your lower back, pelvis, buttocks, or legs that gets worse. ? Redness or swelling of your back. ? A burning sensation when you urinate. This information is not intended to replace advice given to you by your health care provider. Make sure you discuss any questions you have with your health care provider. Document Released: 03/02/2001 Document Revised: 08/12/2015 Document Reviewed: 11/15/2014 Elsevier Interactive Patient Education  2017 [employer]  Let me know in one week if no better.

## 2017-01-28 ENCOUNTER — Ambulatory Visit: Payer: Self-pay | Admitting: Family Medicine

## 2017-01-31 ENCOUNTER — Ambulatory Visit: Payer: Self-pay | Admitting: Family Medicine

## 2017-02-01 ENCOUNTER — Ambulatory Visit (INDEPENDENT_AMBULATORY_CARE_PROVIDER_SITE_OTHER): Payer: Self-pay | Admitting: Family Medicine

## 2017-02-01 ENCOUNTER — Encounter: Payer: Self-pay | Admitting: Family Medicine

## 2017-02-01 VITALS — BP 118/76 | HR 102 | Temp 98.5°F | Wt 241.2 lb

## 2017-02-01 DIAGNOSIS — M5416 Radiculopathy, lumbar region: Secondary | ICD-10-CM

## 2017-02-01 MED ORDER — PREDNISONE 10 MG PO TABS: ORAL_TABLET | ORAL | 0 refills | Status: DC

## 2017-02-01 NOTE — Progress Notes (Signed)
Subjective:     Patient ID: Shaun Steele, male   DOB: 1960-09-07, 56 y.o.   MRN: [id number]  HPI Patient seen for follow-up regarding left lumbar radiculitis pain. He states his pain was 10 out of 10 previously. He been taking high-dose ibuprofen without relief. We given prednisone taper and he states that helped his pain tremendously. Current pain is down to about 4 out of 10. He is able to get out of bed and change positions much better. Denies any weakness or numbness. No urine or stool incontinence.  Past Medical History:  Diagnosis Date  . Arthritis   . Umbilical hernia    Past Surgical History:  Procedure Laterality Date  . HERNIA REPAIR      reports that  has never smoked. he has never used smokeless tobacco. He reports that he does not drink alcohol or use drugs. family history includes Diabetes in his brother, father, maternal grandfather, and mother. No Known Allergies   Review of Systems  Constitutional: Negative for appetite change and unexpected weight change.  Respiratory: Negative for shortness of breath.   Gastrointestinal: Negative for abdominal pain.  Genitourinary: Negative for dysuria.  Musculoskeletal: Positive for back pain.  Neurological: Negative for weakness and numbness.       Objective:   Physical Exam  Constitutional: He appears well-developed and well-nourished.  Cardiovascular: Normal rate and regular rhythm.  Pulmonary/Chest: Effort normal and breath sounds normal. No respiratory distress. He has no wheezes. He has no rales.  Musculoskeletal: He exhibits no edema.  Neurological:  Reflexes range symmetric knee and ankle bilaterally. Full-strength lower extremities with normal sensory function.       Assessment:     Left lumbar radiculitis-greatly improved with prednisone therapy    Plan:     -Patient requesting repeat prednisone prescription. One prescription provided. -Continue simple extension back stretches -Follow-up for any  recurrence of pain after her current dose of prednisone complete  Kristian CoveyBruce W Nickalus Thornsberry MD [facility]

## 2017-02-16 ENCOUNTER — Encounter: Payer: Self-pay | Admitting: Family Medicine

## 2017-02-16 ENCOUNTER — Ambulatory Visit (INDEPENDENT_AMBULATORY_CARE_PROVIDER_SITE_OTHER): Payer: Self-pay | Admitting: Family Medicine

## 2017-02-16 VITALS — BP 120/70 | HR 90 | Temp 98.3°F | Wt 245.2 lb

## 2017-02-16 DIAGNOSIS — M5416 Radiculopathy, lumbar region: Secondary | ICD-10-CM

## 2017-02-16 MED ORDER — PREDNISONE 10 MG PO TABS: ORAL_TABLET | ORAL | 0 refills | Status: DC

## 2017-02-16 NOTE — Progress Notes (Signed)
Subjective:     Patient ID: Shaun Steele, male   DOB: 03/13/1961, 56 y.o.   MRN: [id number]  HPI Patient seen with left lower extremity pain. He was seen here back in October with bilateral lower back pain. He did develop some left lumbar radiculitis type symptoms.   treated with 2 separate courses of prednisone and each time that seemed to help.   He's now had 3 days of fairly sharp pain describes 10 out of 10 severity. No weakness. No urine or stool incontinence. No fevers or chills. Pain radiates from his left buttock all the way down to about the mid to lower leg times. Worse with sitting but he has some pain even with walking and supine. No relief with any other stretches or other conservative measures.  Denies any associated symptoms such as fever or any appetite or weight changes. No dysuria.  Past Medical History:  Diagnosis Date  . Arthritis   . Umbilical hernia    Past Surgical History:  Procedure Laterality Date  . HERNIA REPAIR      reports that  has never smoked. he has never used smokeless tobacco. He reports that he does not drink alcohol or use drugs. family history includes Diabetes in his brother, father, maternal grandfather, and mother. No Known Allergies   Review of Systems  Constitutional: Negative for activity change, appetite change and fever.  Respiratory: Negative for cough and shortness of breath.   Cardiovascular: Negative for chest pain and leg swelling.  Gastrointestinal: Negative for abdominal pain and vomiting.  Genitourinary: Negative for dysuria, flank pain and hematuria.  Musculoskeletal: Positive for back pain. Negative for joint swelling.  Neurological: Negative for weakness and numbness.       Objective:   Physical Exam  Constitutional: He is oriented to person, place, and time. He appears well-developed and well-nourished. No distress.  Neck: No thyromegaly present.  Cardiovascular: Normal rate, regular rhythm and normal heart sounds.  No  murmur heard. Pulmonary/Chest: Effort normal and breath sounds normal. No respiratory distress. He has no wheezes. He has no rales.  Musculoskeletal: He exhibits no edema.  Neurological: He is alert and oriented to person, place, and time. He has normal reflexes. No cranial nerve deficit.  Skin: No rash noted.       Assessment:     Left lower extremity pain. Radiation of pain from the buttock all the way to the leg suggest radiculitis type symptoms which is been battling now for over 6 weeks. He has no focal strength deficits but no significant relief with conservative measures    Plan:     -Set up MRI lumbar spine to further assess -Follow-up immediately for any weakness, numbness, or progressive symptoms  Kristian CoveyBruce W Alonzo Owczarzak MD [facility]

## 2017-02-16 NOTE — Patient Instructions (Signed)
We will set up MRI lumbar spine to further assess.    

## 2017-02-25 ENCOUNTER — Telehealth: Payer: Self-pay | Admitting: Family Medicine

## 2017-02-25 ENCOUNTER — Ambulatory Visit: Payer: Self-pay | Admitting: Family Medicine

## 2017-02-25 MED ORDER — IBUPROFEN 800 MG PO TABS: 800.0000 mg | ORAL_TABLET | Freq: Three times a day (TID) | ORAL | 0 refills | Status: DC | PRN

## 2017-02-25 NOTE — Telephone Encounter (Signed)
Left message on machine for patient to return our call 

## 2017-02-25 NOTE — Telephone Encounter (Signed)
Would not use concomitant prednisone and Motrin.  May refill Motrin 800 mg every 8 hours as needed for pain #30 and take with food. Would avoid further prednisone at this time

## 2017-02-25 NOTE — Telephone Encounter (Signed)
Attempted to contact pt regarding refill request; Left message on voicemail of [phone] to call MDs office; [facility] for further instructions. Copied from CRM [phone]. Topic: Quick Communication - See Telephone Encounter >> Feb 25, 2017 10:44 AM Landry MellowFoltz, Melissa J wrote: CRM for notification. See Telephone encounter for:   02/25/17. Pt is having arthritis type pain in legs and would like to have renewed rx of ibuprofen 800mg .  [facility] [address] Please call [phone] with questions.  >> Feb 25, 2017 10:50 AM Cipriano BunkerLambe, Annette S wrote: Also medicine Predmisone 10mg  refill needed.  [facility] [address]  [facility] to [facility], he was at work and wanted me to .

## 2017-02-25 NOTE — Telephone Encounter (Signed)
Copied from CRM [phone]. Topic: Quick Communication - See Telephone Encounter >> Feb 25, 2017 10:44 AM Landry MellowFoltz, Melissa J wrote: CRM for notification. See Telephone encounter for:   02/25/17. Pt is having arthritis type pain in legs and would like to have renewed rx of ibuprofen 800mg .  [facility] [address] Please call [phone] with questions.

## 2017-02-25 NOTE — Telephone Encounter (Signed)
Patient is aware and rx sent ?

## 2017-02-27 ENCOUNTER — Emergency Department (HOSPITAL_COMMUNITY)
Admission: EM | Admit: 2017-02-27 | Discharge: 2017-02-27 | Disposition: A | Discharge status: Home / Self Care | Payer: Self-pay | Attending: Emergency Medicine | Admitting: Emergency Medicine

## 2017-02-27 ENCOUNTER — Encounter (HOSPITAL_COMMUNITY): Payer: Self-pay

## 2017-02-27 ENCOUNTER — Emergency Department (HOSPITAL_COMMUNITY): Payer: Self-pay

## 2017-02-27 DIAGNOSIS — M79605 Pain in left leg: Secondary | ICD-10-CM | POA: Insufficient documentation

## 2017-02-27 DIAGNOSIS — Z79899 Other long term (current) drug therapy: Secondary | ICD-10-CM | POA: Insufficient documentation

## 2017-02-27 MED ORDER — KETOROLAC TROMETHAMINE 30 MG/ML IJ SOLN
30.0000 mg | Freq: Once | INTRAMUSCULAR | Status: AC
  Administered 2017-02-27: 30 mg via INTRAMUSCULAR
  Filled 2017-02-27: qty 1

## 2017-02-27 MED ORDER — PREGABALIN 75 MG PO CAPS: 75.0000 mg | ORAL_CAPSULE | Freq: Two times a day (BID) | ORAL | 0 refills | Status: AC

## 2017-02-27 NOTE — ED Provider Notes (Signed)
[facility]-EMERGENCY DEPT Provider Note   CSN: [id number] Arrival date & time: 02/27/17  [id number]     History   Chief Complaint Chief Complaint  Patient presents with  . Leg Pain    HPI Shaun Steele is a 56 y.o. male who presents the emergency department today for left leg pain.  Patient has been seeing his PCP over the last 6 weeks for bilateral low back pain with left lumbar radiculitis type symptoms.  He has been treated with 2 separate courses of prednisone that each time have helped, but symptoms have returned.  His most recent flare of was in the past 3 days.  He is now describing describing left buttock pain that is dull/achy and radiates down his hamstring, into the lateral aspect of his calf. The pain is worse with sitting and also with internal rotation of the hip, that creates the pain into a sharp sensation. He feels there may be some tingling associated with this. He notes some improvement with walking and when supine.  He feels that heat helps this area.  In addition to the prednisone he has tried 800 mg ibuprofen that only provided minimal relief. He is scheduled for a MRI on 15th of December by PCP. Patient denies back pain red flags including history of cancer, trauma, fever, night pain, IV drug use, recent spinal manipulation or procedures, upper back pain or neck pain, numbness/weakness of the lower extremities, urinary retention, loss of bowel/bladder function, saddle anesthesia, or unexplained weight loss. He denies risk factors for DVT including exogenous testosterone use, recent surgery or travel, trauma, immobilization, previous blood clot, cancer or family history of bleeding/clotting disorder. No lower extremity swelling. Denies fall or injury.   HPI  Past Medical History:  Diagnosis Date  . Arthritis   . Umbilical hernia     Patient Active Problem List   Diagnosis Date Noted  . Arthritis of right knee 08/22/2013  . Left groin pain 05/22/2013    . Inguinal hernia, left. 04/17/2013    Past Surgical History:  Procedure Laterality Date  . HERNIA REPAIR         Home Medications    Prior to Admission medications   Medication Sig Start Date End Date Taking? Authorizing Provider  ibuprofen (ADVIL,MOTRIN) 200 MG tablet Take 2 tablets (400 mg total) by mouth every 6 (six) hours as needed for moderate pain. 12/24/16   Mesner, Barbara CowerJason, MD  ibuprofen (ADVIL,MOTRIN) 800 MG tablet Take 1 tablet (800 mg total) by mouth every 8 (eight) hours as needed. Take with food 02/25/17   Kristian CoveyBurchette, Bruce W, MD  predniSONE (DELTASONE) 10 MG tablet Taper as follows: 4-4-4-3-3-2-2-1-1 02/16/17   Burchette, Elberta FortisBruce W, MD    Family History Family History  Problem Relation Age of Onset  . Diabetes Mother   . Diabetes Father   . Diabetes Brother   . Diabetes Maternal Grandfather     Social History Social History   Tobacco Use  . Smoking status: Never Smoker  . Smokeless tobacco: Never Used  Substance Use Topics  . Alcohol use: No  . Drug use: No     Allergies   Patient has no known allergies.   Review of Systems Review of Systems  Constitutional: Negative for chills and fever.  Musculoskeletal: Positive for arthralgias. Negative for gait problem and joint swelling.  Skin: Negative for color change.  Neurological: Negative for weakness and numbness.     Physical Exam Updated Vital Signs BP (!) 146/89 (BP  Location: Right Arm)   Pulse 74   Temp 98.1 F (36.7 C) (Oral)   Resp 16   Ht 6\' 2"  (1.88 m)   Wt 108.4 kg (239 lb)   SpO2 99%   BMI 30.69 kg/m   Physical Exam  Constitutional: He appears well-developed and well-nourished.  HENT:  Head: Normocephalic and atraumatic.  Right Ear: External ear normal.  Left Ear: External ear normal.  Eyes: Conjunctivae are normal. Right eye exhibits no discharge. Left eye exhibits no discharge. No scleral icterus.  Cardiovascular:  Pulses:      Dorsalis pedis pulses are 2+ on the left  side.       Posterior tibial pulses are 2+ on the left side.  No lower extremity swelling  Pulmonary/Chest: Effort normal. No respiratory distress.  Musculoskeletal:       Left knee: Normal.       Left ankle: Normal.       Lumbar back: Normal.  Left hip: Appearance normal. No obvious deformity. No skin swelling, erythema, heat, fluctuance or break of the skin. TTP over left hip, left buttocks and superior aspect of hamstring near origin site. Patient has intact active and passive hip flexion, extension, internal/external rotation, abduction and adduction.  He notes pain with internal rotation of the hip. Strength intact for flexion, extension, abduction and adduction. No TTP of the IT band, lower hamstring, knee, calf or ankle.  Negative logroll test.  No sacral crepitus. Neurovascularly intact distally. Compartments soft above and below affected joint.   Neurological: He is alert. He has normal strength. No sensory deficit. Gait normal.  Speech clear. Follows commands. No facial droop. PERRLA. EOM grossly intact. CN III-XII grossly intact. Grossly moves all extremities 4 without ataxia. Able and appropriate strength for age to upper and lower extremities bilaterally including grip strength, and lower leg stregnth.  Normal finger to nose. Normal gait.   Skin: Skin is warm and dry. No erythema. No pallor.  Psychiatric: He has a normal mood and affect.  Nursing note and vitals reviewed.    ED Treatments / Results  Labs (all labs ordered are listed, but only abnormal results are displayed) Labs Reviewed - No data to display  EKG  EKG Interpretation None       Radiology Dg Hip Unilat With Pelvis 2-3 Views Left  Result Date: 02/27/2017 CLINICAL DATA:  Pt c/o anterior moving to posterior hip/buttock pain on left side x 2 wks, no injury, states it is worse when sitting, states he notices he is limping. EXAM: DG HIP (WITH OR WITHOUT PELVIS) 2-3V LEFT COMPARISON:  None. FINDINGS: There is no  evidence of hip fracture or dislocation. There is no evidence of arthropathy or other focal bone abnormality. IMPRESSION: Negative. Electronically Signed   By: Amie Portlandavid  Ormond M.D.   On: 02/27/2017 07:12    Procedures Procedures (including critical care time)  Medications Ordered in ED Medications  ketorolac (TORADOL) 30 MG/ML injection 30 mg (30 mg Intramuscular Given 02/27/17 0723)     Initial Impression / Assessment and Plan / ED Course  I have reviewed the triage vital signs and the nursing notes.  Pertinent labs & imaging results that were available during my care of the patient were reviewed by me and considered in my medical decision making (see chart for details).     56 year old male with on/off over the last 6 weeks with most recent flare of the last 3 days.  Appears to be radiculopathy in nature.  He has  been seen multiple times by his PCP for this and has MRI scheduled for 03/05/2017.  He has tried prednisone with short-term relief but his pain returns.  He is tried ibuprofen with minimal to no relief.  The patient denies any current low back pain.  He denies any bowel or bladder incontinence.  Do not suspect cauda equina syndrome.  He is able to ambulate into the department.  No focal neurologic deficits.  Normal neurologic exam. He is without fever. Patient has full rom over the joint and no overlying erythema or joint swelling. Do not suspect septic joint.  Patient pain is positional, worse with sitting. He has pain with internal rotation of the hip. Pain noted over gluteus, upper hamstring origin and below left hip joint. Normal strength and sensation.  Compartments soft.  X-rays negative.  Patient has already tried NSAIDs and prednisone for his pain.  Will try a trial of Lyrica for radicular pain.  Patient is to get an MRI on the 15th and follow-up with his PCP.  Conservative therapies discussed.  Strict return precautions discussed.  Patient appears safe for discharge.  Final  Clinical Impressions(s) / ED Diagnoses   Final diagnoses:  Left leg pain    ED Discharge Orders        Ordered    pregabalin (LYRICA) 75 MG capsule  2 times daily     02/27/17 0755       Jacinto Halim, PA-C 02/27/17 9604    Pricilla Loveless, MD 03/02/17 1324

## 2017-02-27 NOTE — Discharge Instructions (Signed)
Your seen here today for left leg pain.  Your x-rays were negative.  Please have your MRI during his scheduled time and follow-up with your PCP regarding the results and today's visit.  I am prescribing any medication called Lyrica today will help with nerve pain. Please take as prescribed. This medication can cause drowsiness, weight gain, nausea as well as other side effects. If you develop worsening or new concerning symptoms you can return to the emergency department for re-evaluation.

## 2017-02-27 NOTE — ED Triage Notes (Signed)
Pt arrived via POV from home with complaints of back left leg pain radiating towards his foot. Pt states it hurts more at work but is relieved by movement. Pt has hx of arthritis, no recent injury.

## 2017-03-02 ENCOUNTER — Ambulatory Visit (INDEPENDENT_AMBULATORY_CARE_PROVIDER_SITE_OTHER): Payer: Self-pay | Admitting: Family Medicine

## 2017-03-02 ENCOUNTER — Encounter: Payer: Self-pay | Admitting: Family Medicine

## 2017-03-02 VITALS — BP 110/80 | HR 76 | Temp 98.4°F | Wt 242.4 lb

## 2017-03-02 DIAGNOSIS — M545 Low back pain, unspecified: Secondary | ICD-10-CM

## 2017-03-02 DIAGNOSIS — M2550 Pain in unspecified joint: Secondary | ICD-10-CM

## 2017-03-02 MED ORDER — PREDNISONE 10 MG PO TABS: ORAL_TABLET | ORAL | 0 refills | Status: DC

## 2017-03-02 NOTE — Progress Notes (Signed)
Subjective:     Patient ID: Shaun Steele, male   DOB: [birth year], 56 y.o.   MRN: [id number]  HPI Patient seen for follow-up regarding recurrence of back pain. Had several months of progressive pain. He has had some intermittent radiculitis type symptoms. He has pending MRI scheduled for this coming Saturday. He went to ER this past Saturday with progressive pain. He had x-rays of the pelvis and hip which showed no acute abnormalities. No significant degenerative changes.  Patient still states he feels like he has a "fire "right lower back pain for past 2 days. ER physician placed him on Lyrica 75 mg twice daily and he only took 4 doses but did not see any improvement and stopped this on his own. He has recently taken Motrin which did not help. He states only thing that helps is prednisone. He is on low-dose couple weeks ago.  Denies any urine or stool incontinence. No numbness. No lower extremity weakness. Pain is rated 10 out of 10 at times. Patient relates when he was in [place] back in the mid 80s he was diagnosed with "rheumatoid "arthritis. He does not recall how this was treated or exactly how this was confirmed. He has had some recent bilateral wrist pains. Denies any shoulder pain, hip pain, knee pain, or ankle pain  Past Medical History:  Diagnosis Date  . Arthritis   . Umbilical hernia    Past Surgical History:  Procedure Laterality Date  . HERNIA REPAIR      reports that  has never smoked. he has never used smokeless tobacco. He reports that he does not drink alcohol or use drugs. family history includes Diabetes in his brother, father, maternal grandfather, and mother. No Known Allergies   Review of Systems  Constitutional: Negative for chills and fever.  Respiratory: Negative for shortness of breath.   Cardiovascular: Negative for chest pain.  Genitourinary: Negative for dysuria.  Musculoskeletal: Positive for arthralgias and back pain.  Neurological: Negative for weakness  and numbness.  Hematological: Negative for adenopathy. Does not bruise/bleed easily.       Objective:   Physical Exam  Constitutional: He appears well-developed and well-nourished.  Neck: Neck supple. No thyromegaly present.  Cardiovascular: Normal rate and regular rhythm.  Pulmonary/Chest: Effort normal and breath sounds normal. No respiratory distress. He has no wheezes. He has no rales.  Musculoskeletal: He exhibits no edema.  Straight leg raise are negative bilaterally.  Neurological:  Full-strength lower extremities. Symmetric reflexes.       Assessment:     #1 progressive lower lumbar back pain with intermittent radiculitis symptoms but nonfocal neurologic exam  #2 arthralgias involving both wrists. Reported history of "rheumatoid "disease.    Plan:     -We decided to go ahead with labs including sedimentation rate, antinuclear antibody, rheumatoid factor, CCP antibodies -He has MRI lumbar spine pending for this Saturday -We agreed to one more taper of prednisone since this is only thing that has helped his pain thus far -Hopefully, labs and x-rays will help guide further evaluation and treatment  Kristian CoveyBruce W Deliah Strehlow MD [facility]

## 2017-03-03 LAB — SEDIMENTATION RATE: Sed Rate: 13 mm/hr (ref 0–20)

## 2017-03-03 LAB — CYCLIC CITRUL PEPTIDE ANTIBODY, IGG

## 2017-03-03 LAB — RHEUMATOID FACTOR: Rhuematoid fact SerPl-aCnc: <14 IU/mL (ref <14)

## 2017-03-03 LAB — ANA: Anti Nuclear Antibody(ANA): NEGATIVE

## 2017-03-05 ENCOUNTER — Other Ambulatory Visit: Payer: Self-pay

## 2017-03-14 ENCOUNTER — Encounter (HOSPITAL_COMMUNITY): Payer: Self-pay | Admitting: Emergency Medicine

## 2017-03-14 ENCOUNTER — Emergency Department (HOSPITAL_COMMUNITY)
Admission: EM | Admit: 2017-03-14 | Discharge: 2017-03-14 | Disposition: A | Discharge status: Home / Self Care | Payer: Self-pay | Attending: Emergency Medicine | Admitting: Emergency Medicine

## 2017-03-14 DIAGNOSIS — M62830 Muscle spasm of back: Secondary | ICD-10-CM | POA: Insufficient documentation

## 2017-03-14 DIAGNOSIS — Z79899 Other long term (current) drug therapy: Secondary | ICD-10-CM | POA: Insufficient documentation

## 2017-03-14 DIAGNOSIS — M545 Low back pain: Secondary | ICD-10-CM | POA: Insufficient documentation

## 2017-03-14 MED ORDER — PREDNISONE 20 MG PO TABS: ORAL_TABLET | ORAL | 0 refills | Status: DC

## 2017-03-14 MED ORDER — KETOROLAC TROMETHAMINE 30 MG/ML IJ SOLN
30.0000 mg | Freq: Once | INTRAMUSCULAR | Status: AC
  Administered 2017-03-14: 30 mg via INTRAMUSCULAR
  Filled 2017-03-14: qty 1

## 2017-03-14 MED ORDER — CYCLOBENZAPRINE HCL 10 MG PO TABS: 10.0000 mg | ORAL_TABLET | Freq: Three times a day (TID) | ORAL | 0 refills | Status: DC | PRN

## 2017-03-14 MED ORDER — MELOXICAM 15 MG PO TABS: 15.0000 mg | ORAL_TABLET | Freq: Every day | ORAL | 0 refills | Status: DC

## 2017-03-14 MED ORDER — CYCLOBENZAPRINE HCL 10 MG PO TABS
10.0000 mg | ORAL_TABLET | Freq: Once | ORAL | Status: AC
  Administered 2017-03-14: 10 mg via ORAL
  Filled 2017-03-14: qty 1

## 2017-03-14 MED ORDER — PREDNISONE 20 MG PO TABS
60.0000 mg | ORAL_TABLET | Freq: Once | ORAL | Status: AC
  Administered 2017-03-14: 60 mg via ORAL
  Filled 2017-03-14: qty 3

## 2017-03-14 NOTE — Discharge Instructions (Signed)
Back Pain: Your back pain should be treated with medicines such as mobic and this back pain should get better over the next 2 weeks.  However if you develop severe or worsening pain, low back pain with fever, numbness, weakness or inability to walk or urinate, you should return to the ER immediately.  Please follow up with your doctor this week for a recheck if still having symptoms.  Avoid heavy lifting over 10 pounds over the next two weeks.  Low back pain is discomfort in the lower back that may be due to injuries to muscles and ligaments around the spine.  Occasionally, it may be caused by a a problem to a part of the spine called a disc.  The pain may last several days or a week;  However, most patients get completely well in 4 weeks.  Self - care:  The application of heat can help soothe the pain.  Maintaining your daily activities, including walking, is encourged, as it will help you get better faster than just staying in bed. Perform gentle stretching as discussed. Drink plenty of fluids.  Medications are also useful to help with pain control.  A commonly prescribed medication includes over the counter Tylenol; take as directed on the bottle.  Non steroidal anti inflammatory medications including mobic;  These medications help both pain and swelling and are very useful in treating back pain.  They should be taken with food, as they can cause stomach upset, and more seriously, stomach bleeding.  Don't use additional NSAIDs like ibuprofen/aleve/etc while taking mobic.   Muscle relaxants:  These medications can help with muscle tightness that is a cause of lower back pain.  Most of these medications can cause drowsiness, and it is not safe to drive or use dangerous machinery while taking them.  Prednisone: take as directed, this will help with your radiating pain in your legs. Take in the morning with breakfast, starting tomorrow 03/15/17 since today's dose was given to you here.   SEEK  IMMEDIATE MEDICAL ATTENTION IF: New numbness, tingling, weakness, or problem with the use of your arms or legs.  Severe back pain not relieved with medications.  Difficulty with or loss of control of your bowel or bladder control.  Increasing pain in any areas of the body (such as chest or abdominal pain).  Shortness of breath, dizziness or fainting.  Nausea (feeling sick to your stomach), vomiting, fever, or sweats.  You will need to follow up with  Your primary healthcare provider in 1-2 weeks for reassessment.

## 2017-03-14 NOTE — ED Notes (Signed)
ED Provider at bedside. 

## 2017-03-14 NOTE — ED Triage Notes (Signed)
Patient here from home with complaints of mid upper back pain radiating down into left leg.

## 2017-03-14 NOTE — ED Provider Notes (Signed)
[facility]-EMERGENCY DEPT Provider Note   CSN: [id number] Arrival date & time: 03/14/17  1641     History   Chief Complaint Chief Complaint  Patient presents with  . Back Pain  . Leg Pain    HPI Shaun OvensMostafa Steele is a 56 y.o. male with a PMHx of arthritis and chronic low back pain, who presents to the ED with complaints of ongoing b/l lower back pain which initially started back in October, and he was under the care of his PCP for this, but is awaiting outpatient MRI, and for the last 1 week his pain has been worse. Chart review reveals that his most recent visit to the ED was on 02/27/17 and he was complaining of similar complaints; had L hip xray which was negative; was given toradol 30mg  IM and discharged home with lyrica rx with advice to f/up outpatient.  He states that despite taking the Lyrica, he has not had any improvement in his symptoms.  He describes the pain as 10/10 constant sharp bilateral lower back pain radiating down the left lateral leg, worse with walking and certain movements of his legs, and unrelieved with ibuprofen and Lyrica.  It appears as though he has been prescribed Flexeril and prednisone taper in the past, seems as though those helped over the last few months, but he is no longer taking them.  He denies any recent heavy lifting, falls, trauma, injury, twisting or bending, or any other cause for his back pain.  He denies history of cancer or IV drug use.  He denies fevers, chills, CP, SOB, abd pain, N/V/D/C, hematuria, dysuria, incontinence of urine/stool, saddle anesthesia/cauda equina symptoms, other arthralgias, numbness, tingling, focal weakness, or any other complaints at this time.    The history is provided by the patient and medical records. No language interpreter was used.  Back Pain   This is a recurrent problem. The current episode started more than 1 week ago. The problem occurs constantly. The problem has not changed since onset.The  pain is associated with no known injury. The pain is present in the lumbar spine. Quality: sharp. The pain radiates to the left thigh. The pain is at a severity of 10/10. The pain is moderate. Exacerbated by: movement and walking. The pain is the same all the time. Associated symptoms include leg pain. Pertinent negatives include no chest pain, no fever, no numbness, no abdominal pain, no bowel incontinence, no perianal numbness, no bladder incontinence, no dysuria, no paresthesias, no paresis, no tingling and no weakness. He has tried NSAIDs (and lyrica) for the symptoms. The treatment provided no relief.  Leg Pain   Pertinent negatives include no numbness and no tingling.    Past Medical History:  Diagnosis Date  . Arthritis   . Umbilical hernia     Patient Active Problem List   Diagnosis Date Noted  . Arthritis of right knee 08/22/2013  . Left groin pain 05/22/2013  . Inguinal hernia, left. 04/17/2013    Past Surgical History:  Procedure Laterality Date  . HERNIA REPAIR         Home Medications    Prior to Admission medications   Medication Sig Start Date End Date Taking? Authorizing Provider  ibuprofen (ADVIL,MOTRIN) 800 MG tablet Take 1 tablet (800 mg total) by mouth every 8 (eight) hours as needed. Take with food 02/25/17   Kristian CoveyBurchette, Bruce W, MD  predniSONE (DELTASONE) 10 MG tablet Taper as follows: 4-4-4-3-3-2-2-1-1 03/02/17   Burchette, Elberta FortisBruce W, MD  pregabalin (LYRICA) 75 MG capsule Take 1 capsule (75 mg total) by mouth 2 (two) times daily. 02/27/17   Maczis, Elmer SowMichael M, PA-C    Family History Family History  Problem Relation Age of Onset  . Diabetes Mother   . Diabetes Father   . Diabetes Brother   . Diabetes Maternal Grandfather     Social History Social History   Tobacco Use  . Smoking status: Never Smoker  . Smokeless tobacco: Never Used  Substance Use Topics  . Alcohol use: No  . Drug use: No     Allergies   Patient has no known  allergies.   Review of Systems Review of Systems  Constitutional: Negative for chills and fever.  Respiratory: Negative for shortness of breath.   Cardiovascular: Negative for chest pain.  Gastrointestinal: Negative for abdominal pain, bowel incontinence, constipation, diarrhea, nausea and vomiting.  Genitourinary: Negative for bladder incontinence, difficulty urinating (no incontinence), dysuria and hematuria.  Musculoskeletal: Positive for back pain and myalgias.  Skin: Negative for color change.  Allergic/Immunologic: Negative for immunocompromised state.  Neurological: Negative for tingling, weakness, numbness and paresthesias.  Psychiatric/Behavioral: Negative for confusion.   All other systems reviewed and are negative for acute change except as noted in the HPI.    Physical Exam Updated Vital Signs BP 127/80 (BP Location: Left Arm)   Pulse 99   Temp 97.9 F (36.6 C) (Oral)   Resp 20   Ht 6\' 2"  (1.88 m)   Wt 108.2 kg (238 lb 7 oz)   SpO2 100%   BMI 30.61 kg/m   Physical Exam  Constitutional: He is oriented to person, place, and time. Vital signs are normal. He appears well-developed and well-nourished.  Non-toxic appearance. No distress.  Afebrile, nontoxic, NAD  HENT:  Head: Normocephalic and atraumatic.  Mouth/Throat: Mucous membranes are normal.  Eyes: Conjunctivae and EOM are normal. Right eye exhibits no discharge. Left eye exhibits no discharge.  Neck: Normal range of motion. Neck supple.  Cardiovascular: Normal rate and intact distal pulses.  Pulmonary/Chest: Effort normal. No respiratory distress.  Abdominal: Normal appearance. He exhibits no distension.  Musculoskeletal: Normal range of motion.       Cervical back: Normal.       Thoracic back: Normal.       Lumbar back: He exhibits tenderness and spasm. He exhibits normal range of motion, no bony tenderness and no deformity.       Back:  Lumbar spine with FROM intact without spinous process TTP, no bony  stepoffs or deformities, with mild b/l paraspinous muscle TTP and palpable muscle spasms. Strength and sensation grossly intact in all extremities, negative SLR bilaterally, gait steady and nonantalgic. No overlying skin changes. Distal pulses intact.   Neurological: He is alert and oriented to person, place, and time. He has normal strength. No sensory deficit.  Skin: Skin is warm, dry and intact. No rash noted.  Psychiatric: He has a normal mood and affect.  Nursing note and vitals reviewed.    ED Treatments / Results  Labs (all labs ordered are listed, but only abnormal results are displayed) Labs Reviewed - No data to display  EKG  EKG Interpretation None       Radiology No results found.  Procedures Procedures (including critical care time)  Medications Ordered in ED Medications  ketorolac (TORADOL) 30 MG/ML injection 30 mg (30 mg Intramuscular Given 03/14/17 1955)  cyclobenzaprine (FLEXERIL) tablet 10 mg (10 mg Oral Given 03/14/17 1955)  predniSONE (DELTASONE) tablet 60  mg (60 mg Oral Given 03/14/17 1955)     Initial Impression / Assessment and Plan / ED Course  I have reviewed the triage vital signs and the nursing notes.  Pertinent labs & imaging results that were available during my care of the patient were reviewed by me and considered in my medical decision making (see chart for details).     56 y.o. male here with ongoing lower back pain which has been going on for at least 2 months but worse x1 wk. Was seen by his PCP multiple times in the last few months, and was seen in the ED 02/27/17 for similar complaints; had L hip xray which was negative and he was discharged with lyrica rx. States this hasn't helped. Looks like he's supposed to be getting an MRI per PCP's notes. On exam, mild b/l lumbar paraspinous muscle TTP with palpable muscle spasms, neg SLR bilaterally; no midline spinal tenderness. No red flag s/s of low back pain. No s/s of central cord compression  or cauda equina. Lower extremities are neurovascularly intact and patient is ambulating without difficulty. No urinary complaints. Doubt need for imaging/labs, could be arthritis (prior xrays of lumbar spine in 2014 showed mild degenerative findings) however could be muscular issue vs disc issue, and best next step would be to f/up for his outpatient MRI. Doubt need for emergent MRI today.  Patient was counseled on back pain precautions and told to do activity as tolerated but do not lift, push, or pull heavy objects more than 10 pounds for the next week. Patient counseled to use ice or heat on back for no longer than 15 minutes every hour.   Rx given for muscle relaxer and counseled on proper use of muscle relaxant medication. Urged patient not to drink alcohol, drive, or perform any other activities that requires focus while taking these medications. Rx for mobic given. Advised tylenol use as well. Will also start on short burst of prednisone to see if this helps.   Patient urged to follow-up with PCP in 1wk for recheck of symptoms and ongoing management of his back pain, or sooner if pain does not improve with treatment and rest or if pain becomes recurrent. Urged to return with worsening severe pain, loss of bowel or bladder control, trouble walking. The patient verbalizes understanding and agrees with the plan.    Final Clinical Impressions(s) / ED Diagnoses   Final diagnoses:  Acute bilateral low back pain, with sciatica presence unspecified  Back muscle spasm    ED Discharge Orders        Ordered    cyclobenzaprine (FLEXERIL) 10 MG tablet  3 times daily PRN     03/14/17 1855    predniSONE (DELTASONE) 20 MG tablet     03/14/17 1855    meloxicam (MOBIC) 15 MG tablet  Daily     03/14/17 [address], [place], [place] 03/14/17 2004    Shaune Pollack, MD 03/16/17 [phone]

## 2017-03-19 ENCOUNTER — Other Ambulatory Visit: Payer: Self-pay

## 2017-03-27 ENCOUNTER — Ambulatory Visit
Admission: RE | Admit: 2017-03-27 | Discharge: 2017-03-27 | Disposition: A | Discharge status: Home / Self Care | Payer: Self-pay | Source: Ambulatory Visit | Attending: Family Medicine | Admitting: Family Medicine

## 2017-03-27 DIAGNOSIS — M5416 Radiculopathy, lumbar region: Secondary | ICD-10-CM

## 2017-03-28 ENCOUNTER — Telehealth: Payer: Self-pay | Admitting: Family Medicine

## 2017-03-28 DIAGNOSIS — M545 Low back pain: Secondary | ICD-10-CM

## 2017-03-28 NOTE — Telephone Encounter (Signed)
Copied from CRM [id number]. Topic: Quick Communication - Other Results >> Mar 28, 2017 11:13 AM Oneal GroutSebastian, Jennifer S wrote: Requesting MRI results

## 2017-03-28 NOTE — Telephone Encounter (Signed)
Patient is aware.  Referral placed. 

## 2017-03-29 ENCOUNTER — Ambulatory Visit: Payer: Self-pay | Admitting: Family Medicine

## 2017-06-01 ENCOUNTER — Telehealth: Payer: Self-pay

## 2017-06-01 NOTE — Telephone Encounter (Signed)
Opened in error

## 2017-08-18 ENCOUNTER — Other Ambulatory Visit: Payer: Self-pay | Admitting: Family Medicine

## 2017-08-18 NOTE — Telephone Encounter (Signed)
Copied from CRM [id number]. Topic: Quick Communication - Rx Refill/Question >> Aug 18, 2017 12:47 PM Mickel Baas B, NT wrote: Medication: cyclobenzaprine (FLEXERIL) 10 MG  Has the patient contacted their pharmacy? Yes.   (Agent: If no, request that the patient contact the pharmacy for the refill.) (Agent: If yes, when and what did the pharmacy advise?)  Preferred Pharmacy (with phone number or street name): [id number] [place], [place] - [address]  Agent: Please be advised that RX refills may take up to 3 business days. We ask that you follow-up with your pharmacy.

## 2017-08-19 MED ORDER — CYCLOBENZAPRINE HCL 10 MG PO TABS: 10.0000 mg | ORAL_TABLET | Freq: Three times a day (TID) | ORAL | 0 refills | Status: DC | PRN

## 2017-08-19 NOTE — Telephone Encounter (Signed)
May refill once. 

## 2017-08-19 NOTE — Telephone Encounter (Signed)
LOV 03/02/17 Dr. Caryl Never Last refill 03/14/17  #15 0 refill

## 2017-08-19 NOTE — Addendum Note (Signed)
Addended by: Kern Reap B on: 08/19/2017 01:07 PM   Modules accepted: Orders

## 2017-10-06 ENCOUNTER — Encounter (HOSPITAL_BASED_OUTPATIENT_CLINIC_OR_DEPARTMENT_OTHER): Payer: Self-pay | Admitting: Emergency Medicine

## 2017-10-06 ENCOUNTER — Emergency Department (HOSPITAL_BASED_OUTPATIENT_CLINIC_OR_DEPARTMENT_OTHER)
Admission: EM | Admit: 2017-10-06 | Discharge: 2017-10-06 | Disposition: A | Discharge status: Home / Self Care | Payer: Self-pay | Attending: Emergency Medicine | Admitting: Emergency Medicine

## 2017-10-06 ENCOUNTER — Emergency Department (HOSPITAL_BASED_OUTPATIENT_CLINIC_OR_DEPARTMENT_OTHER): Payer: Self-pay

## 2017-10-06 ENCOUNTER — Other Ambulatory Visit: Payer: Self-pay

## 2017-10-06 DIAGNOSIS — M25511 Pain in right shoulder: Secondary | ICD-10-CM | POA: Insufficient documentation

## 2017-10-06 DIAGNOSIS — Z79899 Other long term (current) drug therapy: Secondary | ICD-10-CM | POA: Insufficient documentation

## 2017-10-06 MED ORDER — NAPROXEN 500 MG PO TABS: 500.0000 mg | ORAL_TABLET | Freq: Two times a day (BID) | ORAL | 0 refills | Status: AC

## 2017-10-06 MED FILL — NAPROXEN 500 MG TABLET: 500 | 4 days supply | Qty: 8 | Fill #0

## 2017-10-06 NOTE — Discharge Instructions (Addendum)
You can by ASPERCREME, ICYHOT, or SALONPAS lidocaine patches over the counter at a drug store/pharmacy. Apply directly to right shoulder where pain is the worst.

## 2017-10-06 NOTE — ED Provider Notes (Signed)
[facility] EMERGENCY DEPARTMENT Provider Note   CSN: [id number] Arrival date & time: 10/06/17  [id number]     History   Chief Complaint Chief Complaint  Patient presents with  . Shoulder Pain    HPI Shaun Steele is a 57 y.o. male.  57yo M who p/w R shoulder pain.  1 week ago he began lifting weights and 3 days ago he started having soreness and pain of his right shoulder, especially when he tries to raise his arm.  He denies any numbness or weakness in his hand.  He states the pain is moderate, constant, and worse with movement.  He tried Advil and icy hot cream which helped somewhat.  He denies any trauma to his shoulder.  The history is provided by the patient.  Shoulder Pain      Past Medical History:  Diagnosis Date  . Arthritis   . Umbilical hernia     Patient Active Problem List   Diagnosis Date Noted  . Arthritis of right knee 08/22/2013  . Left groin pain 05/22/2013  . Inguinal hernia, left. 04/17/2013    Past Surgical History:  Procedure Laterality Date  . HERNIA REPAIR          Home Medications    Prior to Admission medications   Medication Sig Start Date End Date Taking? Authorizing Provider  naproxen (NAPROSYN) 500 MG tablet Take 1 tablet (500 mg total) by mouth 2 (two) times daily for 4 days. Take with food 10/06/17 10/10/17  Mayline Dragon, Ambrose Finlandachel Morgan, MD  pregabalin (LYRICA) 75 MG capsule Take 1 capsule (75 mg total) by mouth 2 (two) times daily. 02/27/17   Maczis, Elmer SowMichael M, PA-C    Family History Family History  Problem Relation Age of Onset  . Diabetes Mother   . Diabetes Father   . Diabetes Brother   . Diabetes Maternal Grandfather     Social History Social History   Tobacco Use  . Smoking status: Never Smoker  . Smokeless tobacco: Never Used  Substance Use Topics  . Alcohol use: No  . Drug use: No     Allergies   Patient has no known allergies.   Review of Systems Review of Systems All other systems reviewed and are  negative except that which was mentioned in HPI   Physical Exam Updated Vital Signs BP (!) 131/94 (BP Location: Left Arm)   Pulse 77   Temp 98.7 F (37.1 C) (Oral)   Resp 16   Ht 6\' 2"  (1.88 m)   Wt 102.1 kg (225 lb)   SpO2 99%   BMI 28.89 kg/m   Physical Exam  Constitutional: He is oriented to person, place, and time. He appears well-developed and well-nourished. No distress.  HENT:  Head: Normocephalic and atraumatic.  Eyes: Conjunctivae are normal.  Neck: Neck supple.  Cardiovascular: Intact distal pulses.  Musculoskeletal: He exhibits no edema.  Tenderness of anterior shoulder near proximal insertion of biceps tendon, unable to raise over 90 degrees 2/2 pain; normal grip strength and normal ROM elbow  Neurological: He is alert and oriented to person, place, and time. No sensory deficit.  Skin: Skin is warm and dry.  Psychiatric: He has a normal mood and affect. Judgment normal.  Nursing note and vitals reviewed.    ED Treatments / Results  Labs (all labs ordered are listed, but only abnormal results are displayed) Labs Reviewed - No data to display  EKG None  Radiology Dg Shoulder Right  Result Date: 10/06/2017 CLINICAL  DATA:  Anterior right shoulder pain and limited range of motion since exercising 3 days ago. Initial encounter. EXAM: RIGHT SHOULDER - 2+ VIEW COMPARISON:  None. FINDINGS: There is no evidence of fracture or dislocation. There is no evidence of arthropathy or other focal bone abnormality. Soft tissues are unremarkable. IMPRESSION: Negative exam. Electronically Signed   By: Drusilla Kanner M.D.   On: 10/06/2017 09:12    Procedures Procedures (including critical care time)  Medications Ordered in ED Medications - No data to display   Initial Impression / Assessment and Plan / ED Course  I have reviewed the triage vital signs and the nursing notes.  Pertinent imaging results that were available during my care of the patient were reviewed by me  and considered in my medical decision making (see chart for details).    XR negative acute. No signs/sx to suggest infection. Possible biceps tendonitis vs rotator cuff injury, exam limited 2/2 pain. Recommended NSAID course and f/u w/ sports med if no improvement. Discussed supportive measures including lidocaine patch. PT voiced understanding.  Final Clinical Impressions(s) / ED Diagnoses   Final diagnoses:  Acute pain of right shoulder    ED Discharge Orders        Ordered    naproxen (NAPROSYN) 500 MG tablet  2 times daily     10/06/17 1002       Rochella Benner, Ambrose Finland, MD 10/06/17 1106

## 2017-10-06 NOTE — ED Triage Notes (Signed)
R shoulder pain x 1 week after lifting weights.

## 2017-12-12 ENCOUNTER — Other Ambulatory Visit: Payer: Self-pay

## 2017-12-12 ENCOUNTER — Emergency Department (HOSPITAL_BASED_OUTPATIENT_CLINIC_OR_DEPARTMENT_OTHER)
Admission: EM | Admit: 2017-12-12 | Discharge: 2017-12-12 | Disposition: A | Discharge status: Home / Self Care | Payer: Self-pay | Attending: Emergency Medicine | Admitting: Emergency Medicine

## 2017-12-12 ENCOUNTER — Encounter (HOSPITAL_BASED_OUTPATIENT_CLINIC_OR_DEPARTMENT_OTHER): Payer: Self-pay | Admitting: *Deleted

## 2017-12-12 DIAGNOSIS — T148XXA Other injury of unspecified body region, initial encounter: Secondary | ICD-10-CM

## 2017-12-12 DIAGNOSIS — Y939 Activity, unspecified: Secondary | ICD-10-CM | POA: Insufficient documentation

## 2017-12-12 DIAGNOSIS — Y999 Unspecified external cause status: Secondary | ICD-10-CM | POA: Insufficient documentation

## 2017-12-12 DIAGNOSIS — Y929 Unspecified place or not applicable: Secondary | ICD-10-CM | POA: Insufficient documentation

## 2017-12-12 DIAGNOSIS — W0110XA Fall on same level from slipping, tripping and stumbling with subsequent striking against unspecified object, initial encounter: Secondary | ICD-10-CM | POA: Insufficient documentation

## 2017-12-12 DIAGNOSIS — S39012A Strain of muscle, fascia and tendon of lower back, initial encounter: Secondary | ICD-10-CM | POA: Insufficient documentation

## 2017-12-12 MED ORDER — KETOROLAC TROMETHAMINE 15 MG/ML IJ SOLN
15.0000 mg | Freq: Once | INTRAMUSCULAR | Status: AC
  Administered 2017-12-12: 15 mg via INTRAMUSCULAR
  Filled 2017-12-12: qty 1

## 2017-12-12 MED ORDER — METHOCARBAMOL 500 MG PO TABS: 500.0000 mg | ORAL_TABLET | Freq: Every evening | ORAL | 0 refills | Status: AC | PRN

## 2017-12-12 NOTE — ED Provider Notes (Signed)
[facility] EMERGENCY DEPARTMENT Provider Note   CSN: [id number] Arrival date & time: 12/12/17  1608     History   Chief Complaint Chief Complaint  Patient presents with  . Back Pain    HPI Shaun Steele is a 57 y.o. male ending for evaluation of back pain.  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  He had acute onset left low back pain.  This occurred around 130 this afternoon.  He denies fall or trauma.  He denies pain elsewhere.  Pain is present with any movement, no pain at rest.  He denies radiation of the pain.  No radiation down his leg.  He denies numbness or tingling.  No loss of bowel or bladder control.  He took Aleve which improved his symptoms for approximately 1 hour before symptoms worsened again.  He has not tried anything else.  He has no other medical problems, takes no medications daily.  HPI  Past Medical History:  Diagnosis Date  . Arthritis   . Umbilical hernia     Patient Active Problem List   Diagnosis Date Noted  . Arthritis of right knee 08/22/2013  . Left groin pain 05/22/2013  . Inguinal hernia, left. 04/17/2013    Past Surgical History:  Procedure Laterality Date  . HERNIA REPAIR          Home Medications    Prior to Admission medications   Medication Sig Start Date End Date Taking? Authorizing Provider  methocarbamol (ROBAXIN) 500 MG tablet Take 1 tablet (500 mg total) by mouth at bedtime as needed for muscle spasms. 12/12/17   Amiel Sharrow, PA-C  pregabalin (LYRICA) 75 MG capsule Take 1 capsule (75 mg total) by mouth 2 (two) times daily. 02/27/17   Maczis, Elmer Sow, PA-C    Family History Family History  Problem Relation Age of Onset  . Diabetes Mother   . Diabetes Father   . Diabetes Brother   . Diabetes Maternal Grandfather     Social History Social History   Tobacco Use  . Smoking status: Never Smoker  . Smokeless tobacco: Never Used  Substance  Use Topics  . Alcohol use: No  . Drug use: No     Allergies   Patient has no known allergies.   Review of Systems Review of Systems  Musculoskeletal: Positive for myalgias.  Neurological: Negative for numbness.     Physical Exam Updated Vital Signs BP (!) 147/94   Pulse 86   Temp 98.2 F (36.8 C) (Oral)   Resp 18   Ht 6\' 2"  (1.88 m)   Wt 102.1 kg   SpO2 98%   BMI 28.89 kg/m   Physical Exam  Constitutional: He is oriented to person, place, and time. He appears well-developed and well-nourished. No distress.  HENT:  Head: Normocephalic and atraumatic.  Eyes: EOM are normal.  Neck: Normal range of motion.  Cardiovascular: Normal rate, regular rhythm and intact distal pulses.  Pulmonary/Chest: Effort normal and breath sounds normal. No respiratory distress. He has no wheezes.  Abdominal: Soft. He exhibits no distension. There is no tenderness.  Musculoskeletal: Normal range of motion. He exhibits tenderness.  Tenderness palpation of left low back musculature.  No pain over midline spine.  No pain elsewhere in the back.  Patient is ambulatory.  Sensation of lower extremities intact bilaterally.  Pedal pulses intact bilaterally.  Soft compartments.  No obvious swelling, hematoma, or deformity.  Neurological: He is alert and oriented to person, place, and time.  Skin: Skin is warm. No rash noted.  Psychiatric: He has a normal mood and affect.  Nursing note and vitals reviewed.    ED Treatments / Results  Labs (all labs ordered are listed, but only abnormal results are displayed) Labs Reviewed - No data to display  EKG None  Radiology No results found.  Procedures Procedures (including critical care time)  Medications Ordered in ED Medications  ketorolac (TORADOL) 15 MG/ML injection 15 mg (has no administration in time range)     Initial Impression / Assessment and Plan / ED Course  I have reviewed the triage vital signs and the nursing notes.  Pertinent  labs & imaging results that were available during my care of the patient were reviewed by me and considered in my medical decision making (see chart for details).     Patient presenting for evaluation of low back pain.  Physical exam reassuring, neurovascularly intact.  No red flags for back pain.  Pain is reproducible with palpation of the musculature.  Likely musculoskeletal.  Doubt fracture, I do not believe x-rays will be beneficial.  Doubt vertebral injury, infection, spinal cord compression, myelopathy, or cauda equina syndrome.  Will treat symptomatically with NSAIDs, muscle relaxers, muscle creams.  Patient given information to follow-up as needed.  At this time, patient appears safe for discharge.  Return precautions given.  Patient states he understands agrees plan.   Final Clinical Impressions(s) / ED Diagnoses   Final diagnoses:  Muscle strain    ED Discharge Orders         Ordered    methocarbamol (ROBAXIN) 500 MG tablet  At bedtime PRN     12/12/17 1834           Alveria ApleyCaccavale, Armonte Tortorella, PA-C 12/12/17 1840    Terrilee FilesButler, Michael C, MD 12/13/17 1137

## 2017-12-12 NOTE — ED Triage Notes (Signed)
Back pain this am. 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 He is called his supervisor to see if this injury is workmans comp related and if UDS is required.

## 2017-12-12 NOTE — ED Notes (Signed)
Pt verbalizes understanding of d/c instructions and denies any further needs at this time. 

## 2017-12-12 NOTE — Discharge Instructions (Signed)
Take aleve 2 times a day with meals.  Take 2 tablets at a time. Do not take other anti-inflammatories at the same time (Advil, Motrin, ibuprofen, naproxen). You may supplement with Tylenol if you need further pain control. Use Robaxin as needed for muscle stiffness or soreness. Have caution, as this may make you tired or groggy. Do not drive or operate heavy machinery while taking this medication.  Use muscle creams (bengay, icy hot, salonpas) as needed for pain.  Use heat and stretching to help with pain Follow up with further evaluation of your pain is not improving in the next week.  Follow-up with occupational health as needed for further evaluation and if you are unable to return to work..  Return to the ER if you develop high fevers, numbness, loss of bowel or bladder control, or any new or concerning symptoms.

## 2019-03-01 ENCOUNTER — Other Ambulatory Visit: Payer: Self-pay

## 2019-03-01 DIAGNOSIS — Z20822 Contact with and (suspected) exposure to covid-19: Secondary | ICD-10-CM

## 2019-03-03 LAB — NOVEL CORONAVIRUS, NAA: SARS-CoV-2, NAA: NOT DETECTED

## 2019-03-05 ENCOUNTER — Other Ambulatory Visit: Payer: Self-pay

## 2019-03-05 ENCOUNTER — Emergency Department (HOSPITAL_COMMUNITY)
Admission: EM | Admit: 2019-03-05 | Discharge: 2019-03-05 | Disposition: A | Discharge status: Home / Self Care | Payer: BC Managed Care – PPO | Attending: Emergency Medicine | Admitting: Emergency Medicine

## 2019-03-05 ENCOUNTER — Encounter (HOSPITAL_COMMUNITY): Payer: Self-pay | Admitting: Emergency Medicine

## 2019-03-05 DIAGNOSIS — Z79899 Other long term (current) drug therapy: Secondary | ICD-10-CM | POA: Insufficient documentation

## 2019-03-05 DIAGNOSIS — M5441 Lumbago with sciatica, right side: Secondary | ICD-10-CM | POA: Insufficient documentation

## 2019-03-05 DIAGNOSIS — M545 Low back pain: Secondary | ICD-10-CM | POA: Diagnosis present

## 2019-03-05 MED ORDER — METHYLPREDNISOLONE SODIUM SUCC 125 MG IJ SOLR
125.0000 mg | Freq: Once | INTRAMUSCULAR | Status: AC
  Administered 2019-03-05: 21:00:00 125 mg via INTRAMUSCULAR
  Filled 2019-03-05: qty 2

## 2019-03-05 MED ORDER — KETOROLAC TROMETHAMINE 60 MG/2ML IM SOLN
60.0000 mg | Freq: Once | INTRAMUSCULAR | Status: AC
  Administered 2019-03-05: 60 mg via INTRAMUSCULAR
  Filled 2019-03-05: qty 2

## 2019-03-05 MED ORDER — HYDROCODONE-ACETAMINOPHEN 5-325 MG PO TABS: 1.0000 | ORAL_TABLET | ORAL | 0 refills | Status: AC | PRN

## 2019-03-05 MED ORDER — PREDNISONE 10 MG PO TABS: ORAL_TABLET | ORAL | 0 refills | Status: AC

## 2019-03-05 NOTE — ED Triage Notes (Signed)
Patient complaining of lower back pain that has been going on for 6 months. Patient states a week ago he had an x ray and the md told him that it was bones sitting on a nerve. Patient state he came here because he can not take the pain anymore.

## 2019-03-05 NOTE — ED Provider Notes (Signed)
[facility] DEPT Provider Note   CSN: [id number] Arrival date & time: 03/05/19  1926     History Chief Complaint  Patient presents with  . Back Pain    Shaun Steele is a 58 y.o. male.  The history is provided by the patient. No language interpreter was used.  Back Pain Location:  Lumbar spine Quality:  Aching Radiates to:  Does not radiate Pain severity:  Moderate Pain is:  Worse during the night Timing:  Constant Progression:  Worsening Chronicity:  New Relieved by:  Nothing Worsened by:  Nothing Ineffective treatments:  None tried Associated symptoms: no tingling and no weakness    Pt saw Dr. Marcelino Scot for low back pain.  Pt reports he is suppose to have an MRI.  Pt reports increased pain.  No relief from current medications.     Past Medical History:  Diagnosis Date  . Arthritis   . Umbilical hernia     Patient Active Problem List   Diagnosis Date Noted  . Arthritis of right knee 08/22/2013  . Left groin pain 05/22/2013  . Inguinal hernia, left. 04/17/2013    Past Surgical History:  Procedure Laterality Date  . HERNIA REPAIR         Family History  Problem Relation Age of Onset  . Diabetes Mother   . Diabetes Father   . Diabetes Brother   . Diabetes Maternal Grandfather     Social History   Tobacco Use  . Smoking status: Never Smoker  . Smokeless tobacco: Never Used  Substance Use Topics  . Alcohol use: No  . Drug use: No    Home Medications Prior to Admission medications   Medication Sig Start Date End Date Taking? Authorizing Provider  methocarbamol (ROBAXIN) 500 MG tablet Take 1 tablet (500 mg total) by mouth at bedtime as needed for muscle spasms. 12/12/17   Caccavale, Sophia, PA-C  pregabalin (LYRICA) 75 MG capsule Take 1 capsule (75 mg total) by mouth 2 (two) times daily. 02/27/17   Maczis, Barth Kirks, PA-C    Allergies    Patient has no known allergies.  Review of Systems   Review of Systems   Musculoskeletal: Positive for back pain.  Neurological: Negative for tingling and weakness.  All other systems reviewed and are negative.   Physical Exam Updated Vital Signs BP (!) 138/101 (BP Location: Left Arm)   Pulse 87   Temp 98.2 F (36.8 C) (Oral)   Resp 18   Ht 6\' 2"  (1.88 m)   Wt 95.3 kg   SpO2 99%   BMI 26.96 kg/m   Physical Exam Vitals and nursing note reviewed.  Constitutional:      Appearance: He is well-developed.  HENT:     Head: Normocephalic and atraumatic.  Eyes:     Conjunctiva/sclera: Conjunctivae normal.  Cardiovascular:     Rate and Rhythm: Normal rate and regular rhythm.     Heart sounds: No murmur.  Pulmonary:     Effort: Pulmonary effort is normal. No respiratory distress.     Breath sounds: Normal breath sounds.  Abdominal:     Palpations: Abdomen is soft.     Tenderness: There is no abdominal tenderness.  Musculoskeletal:        General: Tenderness present.     Cervical back: Neck supple.     Comments: Tender ls spine, pain with movement, nv and ns intact   Skin:    General: Skin is warm and dry.  Neurological:  Mental Status: He is alert.     ED Results / Procedures / Treatments   Labs (all labs ordered are listed, but only abnormal results are displayed) Labs Reviewed - No data to display  EKG None  Radiology No results found.  An After Visit Summary was printed and given to the patient. Procedures Procedures (including critical care time)  Medications Ordered in ED Medications  methylPREDNISolone sodium succinate (SOLU-MEDROL) 125 mg/2 mL injection 125 mg (has no administration in time range)  ketorolac (TORADOL) injection 60 mg (has no administration in time range)    ED Course  I have reviewed the triage vital signs and the nursing notes.  Pertinent labs & imaging results that were available during my care of the patient were reviewed by me and considered in my medical decision making (see chart for details).     MDM Rules/Calculators/A&P                      MDM  Pt given solumedrol and toradol IM.  Rx for prednisone  Final Clinical Impression(s) / ED Diagnoses Final diagnoses:  Acute midline low back pain with right-sided sciatica    Rx / DC Orders ED Discharge Orders         Ordered    predniSONE (DELTASONE) 10 MG tablet     03/05/19 2107    HYDROcodone-acetaminophen (NORCO/VICODIN) 5-325 MG tablet  Every 4 hours PRN     03/05/19 2107           Osie Cheeks 03/05/19 2214    Wynetta Fines, MD 03/05/19 2316

## 2019-03-05 NOTE — Discharge Instructions (Addendum)
See Dr. Marcelino Scot for recheck

## 2019-03-08 ENCOUNTER — Emergency Department (HOSPITAL_COMMUNITY)
Admission: EM | Admit: 2019-03-08 | Discharge: 2019-03-08 | Disposition: A | Discharge status: Home / Self Care | Payer: BC Managed Care – PPO | Attending: Emergency Medicine | Admitting: Emergency Medicine

## 2019-03-08 DIAGNOSIS — M5431 Sciatica, right side: Secondary | ICD-10-CM | POA: Insufficient documentation

## 2019-03-08 DIAGNOSIS — R200 Anesthesia of skin: Secondary | ICD-10-CM | POA: Insufficient documentation

## 2019-03-08 DIAGNOSIS — Z79899 Other long term (current) drug therapy: Secondary | ICD-10-CM | POA: Insufficient documentation

## 2019-03-08 DIAGNOSIS — M79604 Pain in right leg: Secondary | ICD-10-CM | POA: Diagnosis present

## 2019-03-08 MED ORDER — HYDROMORPHONE HCL 1 MG/ML IJ SOLN
0.5000 mg | Freq: Once | INTRAMUSCULAR | Status: AC
  Administered 2019-03-08: 13:00:00 0.5 mg via INTRAMUSCULAR
  Filled 2019-03-08: qty 1

## 2019-03-08 MED ORDER — KETOROLAC TROMETHAMINE 30 MG/ML IJ SOLN
30.0000 mg | Freq: Once | INTRAMUSCULAR | Status: AC
  Administered 2019-03-08: 30 mg via INTRAMUSCULAR
  Filled 2019-03-08: qty 1

## 2019-03-08 MED ORDER — CYCLOBENZAPRINE HCL 10 MG PO TABS: 10.0000 mg | ORAL_TABLET | Freq: Two times a day (BID) | ORAL | 0 refills | Status: AC | PRN

## 2019-03-08 MED ORDER — METHOCARBAMOL 500 MG PO TABS
500.0000 mg | ORAL_TABLET | Freq: Once | ORAL | Status: AC
  Administered 2019-03-08: 500 mg via ORAL
  Filled 2019-03-08: qty 1

## 2019-03-08 MED ORDER — LIDOCAINE 5 % EX PTCH: 1.0000 | MEDICATED_PATCH | CUTANEOUS | 0 refills | Status: AC

## 2019-03-08 MED ORDER — LIDOCAINE 5 % EX PTCH
1.0000 | MEDICATED_PATCH | CUTANEOUS | Status: DC
  Administered 2019-03-08: 1 via TRANSDERMAL
  Filled 2019-03-08: qty 1

## 2019-03-08 NOTE — ED Triage Notes (Signed)
Pt to ER for persistent lower back pain with right leg radiation, was seen on 12/14 and discharged. Was given prescriptions and reports no relief. Reports today pain is worse and he is unable to walk with his right leg, strength in right leg is equal to left. Reports pain with walking. Denies bowel/bladder incontinence. Reports he is supposed to have an MRI soon but cannot take the pain anymore.

## 2019-03-08 NOTE — ED Notes (Signed)
Patient verbalizes understanding of discharge instructions. Opportunity for questioning and answers were provided. Pt discharged from ED. 

## 2019-03-08 NOTE — ED Provider Notes (Signed)
[facility] EMERGENCY DEPARTMENT Provider Note   CSN: [id number] Arrival date & time: 03/08/19  1105     History Chief Complaint  Patient presents with  . Leg Pain    Shaun Steele is a 58 y.o. male with a past medical history of DDD presenting to the ED with a chief complaint of right lower back pain.  Reports sharp right lower back pain radiating to his right leg with associated right lower extremity paresthesias.  States his symptoms have been going on for the past week, he has had intermittent similar symptoms for the past several years but have gotten worse in the past day.  He was told in the past that he had a pinched nerve in his lower back.  Reports increased pain secondary to walking, but no specific alleviating factor.  He has tried the medications prescribed to him including muscle relaxer and Norco with minimal improvement in his symptoms.  He had an MRI done 1 year ago which showed degenerative changes in his spine but no acute findings.  Denies any prior back surgeries, history of cancer, she of IV drug use, chest pain, shortness of breath, loss of bowel or bladder function, dysuria, fever, injuries or falls.  HPI     Past Medical History:  Diagnosis Date  . Arthritis   . Umbilical hernia     Patient Active Problem List   Diagnosis Date Noted  . Arthritis of right knee 08/22/2013  . Left groin pain 05/22/2013  . Inguinal hernia, left. 04/17/2013    Past Surgical History:  Procedure Laterality Date  . HERNIA REPAIR         Family History  Problem Relation Age of Onset  . Diabetes Mother   . Diabetes Father   . Diabetes Brother   . Diabetes Maternal Grandfather     Social History   Tobacco Use  . Smoking status: Never Smoker  . Smokeless tobacco: Never Used  Substance Use Topics  . Alcohol use: No  . Drug use: No    Home Medications Prior to Admission medications   Medication Sig Start Date End Date Taking? Authorizing  Provider  cyclobenzaprine (FLEXERIL) 10 MG tablet Take 1 tablet (10 mg total) by mouth 2 (two) times daily as needed for muscle spasms. 03/08/19   Adalind Weitz, PA-C  HYDROcodone-acetaminophen (NORCO/VICODIN) 5-325 MG tablet Take 1 tablet by mouth every 4 (four) hours as needed for moderate pain. 03/05/19 03/04/20  Elson Areas, PA-C  lidocaine (LIDODERM) 5 % Place 1 patch onto the skin daily. Remove & Discard patch within 12 hours or as directed by MD 03/08/19   Dietrich Pates, PA-C  methocarbamol (ROBAXIN) 500 MG tablet Take 1 tablet (500 mg total) by mouth at bedtime as needed for muscle spasms. 12/12/17   Caccavale, Sophia, PA-C  predniSONE (DELTASONE) 10 MG tablet 6 tablets 1st day, 5 tablets 2nd day, 4 tablets 3rd day, 3 tablets 4th day, 2 tablets 5th day and 1 tablet 6th day 03/05/19   Elson Areas, PA-C  pregabalin (LYRICA) 75 MG capsule Take 1 capsule (75 mg total) by mouth 2 (two) times daily. 02/27/17   Maczis, Elmer Sow, PA-C    Allergies    Patient has no known allergies.  Review of Systems   Review of Systems  Constitutional: Negative for appetite change, chills and fever.  HENT: Negative for ear pain, rhinorrhea, sneezing and sore throat.   Eyes: Negative for photophobia and visual disturbance.  Respiratory: Negative  for cough, chest tightness, shortness of breath and wheezing.   Cardiovascular: Negative for chest pain and palpitations.  Gastrointestinal: Negative for abdominal pain, blood in stool, constipation, diarrhea, nausea and vomiting.  Genitourinary: Negative for dysuria, hematuria and urgency.  Musculoskeletal: Positive for back pain and myalgias.  Skin: Negative for rash.  Neurological: Negative for dizziness, weakness and light-headedness.    Physical Exam Updated Vital Signs BP 133/84 (BP Location: Right Arm)   Pulse 60   Temp 98.2 F (36.8 C) (Oral)   Resp 16   SpO2 100%   Physical Exam Vitals and nursing note reviewed.  Constitutional:       General: He is not in acute distress.    Appearance: He is well-developed.     Comments: Nontoxic appearing in no acute distress.  Able to stand.  HENT:     Head: Normocephalic and atraumatic.     Nose: Nose normal.  Eyes:     General: No scleral icterus.       Left eye: No discharge.     Conjunctiva/sclera: Conjunctivae normal.  Cardiovascular:     Rate and Rhythm: Normal rate and regular rhythm.     Heart sounds: Normal heart sounds. No murmur. No friction rub. No gallop.   Pulmonary:     Effort: Pulmonary effort is normal. No respiratory distress.     Breath sounds: Normal breath sounds.  Abdominal:     General: Bowel sounds are normal. There is no distension.     Palpations: Abdomen is soft.     Tenderness: There is no abdominal tenderness. There is no guarding.  Musculoskeletal:        General: Normal range of motion.     Cervical back: Normal range of motion and neck supple. Tenderness present. No bony tenderness.       Back:     Comments: Tenderness palpation of the right paraspinal musculature of the lumbar spine as noted in the image.  Pain with SLR on the right side.  Sensation intact to light touch of bilateral lower extremities.  No calf tenderness.  No step-off palpated at midline.  No objective signs of numbness noted.  No signs of saddle anesthesia.  Strength 5/5 in bilateral lower extremities.  2+ DP pulses bilaterally.  Skin:    General: Skin is warm and dry.     Findings: No rash.  Neurological:     Mental Status: He is alert.     Motor: No abnormal muscle tone.     Coordination: Coordination normal.     ED Results / Procedures / Treatments   Labs (all labs ordered are listed, but only abnormal results are displayed) Labs Reviewed - No data to display  EKG None  Radiology No results found.  Procedures Procedures (including critical care time)  Medications Ordered in ED Medications  lidocaine (LIDODERM) 5 % 1 patch (1 patch Transdermal Patch  Applied 03/08/19 1241)  HYDROmorphone (DILAUDID) injection 0.5 mg (0.5 mg Intramuscular Given 03/08/19 1239)  methocarbamol (ROBAXIN) tablet 500 mg (500 mg Oral Given 03/08/19 1237)  ketorolac (TORADOL) 30 MG/ML injection 30 mg (30 mg Intramuscular Given 03/08/19 1240)    ED Course  I have reviewed the triage vital signs and the nursing notes.  Pertinent labs & imaging results that were available during my care of the patient were reviewed by me and considered in my medical decision making (see chart for details).    MDM Rules/Calculators/A&P  Patient denies any concerning symptoms suggestive of cauda equina requiring urgent imaging at this time such as loss of sensation in the lower extremities, lower extremity weakness, loss of bowel or bladder control, saddle anesthesia, urinary retention, fever/chills, IVDU. Exam demonstrated no  weakness on exam today. No preceding injury or trauma to suggest acute fracture. Doubt pelvic or urinary pathology for patient's acute back pain, as patient denies urinary symptoms.  He reports inability to walk secondary to pain and pain was treated here.  I was able to see him stand and support himself without assistance.  No weakness noted on lower extremities and no changes to sensation.  Doubt AAA as cause of patient's back pain as patient lacks major risk factors, had no abdominal TTP, and has symmetric and intact distal pulses. Patient given strict return precautions for any symptoms indicating worsening neurologic function in the lower extremities.  It is unclear to me whether he is actually taking the prednisone that was prescribed to him 3 days ago, because he told me what he is taking is called is nabumetone. I did encourage him to take the prescriptions that were given to him 3 days ago.  Patient is hemodynamically stable, in NAD, and able to ambulate in the ED. Evaluation does not show pathology that would require ongoing emergent  intervention or inpatient treatment. I explained the diagnosis to the patient. Pain has been managed and has no complaints prior to discharge. Patient is comfortable with above plan and is stable for discharge at this time. All questions were answered prior to disposition. Strict return precautions for returning to the ED were discussed. Encouraged follow up with PCP.   An After Visit Summary was printed and given to the patient.   Portions of this note were generated with Scientist, clinical (histocompatibility and immunogenetics)Dragon dictation software. Dictation errors may occur despite best attempts at proofreading.   Final Clinical Impression(s) / ED Diagnoses Final diagnoses:  Sciatica of right side    Rx / DC Orders ED Discharge Orders         Ordered    cyclobenzaprine (FLEXERIL) 10 MG tablet  2 times daily PRN     03/08/19 1347    lidocaine (LIDODERM) 5 %  Every 24 hours     03/08/19 1347           Dietrich PatesKhatri, Linc Renne, PA-C 03/08/19 1350    Virgina NorfolkCuratolo, Adam, DO 03/08/19 1556

## 2019-03-08 NOTE — ED Notes (Signed)
Pt also reports numbness to right leg.

## 2019-03-08 NOTE — Discharge Instructions (Signed)
Take the medications given today in addition to the medications given at your visit 2 days ago to help with your back pain. It is important for you to consistently take his medications for maximal improvement. You will need to follow-up with your primary care provider regarding an MRI that can be scheduled outpatient. Return to the ED if you start to have chest pain, shortness of breath, losing control of your bowels or bladder, injuries or falls.

## 2019-03-09 ENCOUNTER — Emergency Department (HOSPITAL_COMMUNITY)
Admission: EM | Admit: 2019-03-09 | Discharge: 2019-03-09 | Disposition: A | Discharge status: Home / Self Care | Payer: BC Managed Care – PPO | Attending: Emergency Medicine | Admitting: Emergency Medicine

## 2019-03-09 ENCOUNTER — Other Ambulatory Visit: Payer: Self-pay

## 2019-03-09 ENCOUNTER — Encounter (HOSPITAL_COMMUNITY): Payer: Self-pay | Admitting: Emergency Medicine

## 2019-03-09 ENCOUNTER — Emergency Department (HOSPITAL_COMMUNITY): Payer: BC Managed Care – PPO

## 2019-03-09 DIAGNOSIS — Z79899 Other long term (current) drug therapy: Secondary | ICD-10-CM | POA: Diagnosis not present

## 2019-03-09 DIAGNOSIS — M5126 Other intervertebral disc displacement, lumbar region: Secondary | ICD-10-CM

## 2019-03-09 DIAGNOSIS — M545 Low back pain: Secondary | ICD-10-CM | POA: Diagnosis present

## 2019-03-09 DIAGNOSIS — M5431 Sciatica, right side: Secondary | ICD-10-CM

## 2019-03-09 DIAGNOSIS — M5441 Lumbago with sciatica, right side: Secondary | ICD-10-CM | POA: Insufficient documentation

## 2019-03-09 MED ORDER — HYDROMORPHONE HCL 1 MG/ML IJ SOLN
1.0000 mg | Freq: Once | INTRAMUSCULAR | Status: AC
  Administered 2019-03-09: 1 mg via INTRAMUSCULAR
  Filled 2019-03-09: qty 1

## 2019-03-09 NOTE — ED Triage Notes (Signed)
Pt reports that he has lower back pains that radiates down his right leg causing him not to be able to walk. Reports was seen for this recently and "just don't want to keep taking medications".

## 2019-03-09 NOTE — ED Notes (Signed)
Patient assisted to restroom with wheelchair.

## 2019-03-09 NOTE — ED Notes (Signed)
ED Provider at bedside. 

## 2019-03-09 NOTE — ED Notes (Signed)
Patient transported to MRI 

## 2019-03-09 NOTE — ED Provider Notes (Signed)
[facility]-EMERGENCY DEPT Provider Note   CSN: [id number] Arrival date & time: 03/09/19  1042     History Chief Complaint  Patient presents with  . Back Pain  . Leg Pain    Bliss Tsang is a 58 y.o. male.  58 y.o male with a PMH of Arthritis, DDD presents to the ED with a chief complaint of lumbar back pain and right leg weakness x 14 days.   The history is provided by the patient.  Back Pain Location:  Lumbar spine Quality:  Burning Radiates to:  R posterior upper leg Pain severity:  Severe Pain is:  Same all the time Onset quality:  Gradual Duration:  4 days Timing:  Constant Progression:  Worsening Chronicity:  Recurrent Context: not falling, not jumping from heights, not recent illness, not recent injury and not twisting   Relieved by:  Nothing Worsened by:  Ambulation Ineffective treatments:  Ibuprofen, lying down, muscle relaxants, OTC medications, NSAIDs, narcotics, heating pad and bed rest Associated symptoms: leg pain, paresthesias and weakness   Associated symptoms: no tingling   Risk factors: no hx of cancer, no hx of osteoporosis, no lack of exercise, no recent surgery and no steroid use   Leg Pain Associated symptoms: back pain        Past Medical History:  Diagnosis Date  . Arthritis   . Umbilical hernia     Patient Active Problem List   Diagnosis Date Noted  . Arthritis of right knee 08/22/2013  . Left groin pain 05/22/2013  . Inguinal hernia, left. 04/17/2013    Past Surgical History:  Procedure Laterality Date  . HERNIA REPAIR         Family History  Problem Relation Age of Onset  . Diabetes Mother   . Diabetes Father   . Diabetes Brother   . Diabetes Maternal Grandfather     Social History   Tobacco Use  . Smoking status: Never Smoker  . Smokeless tobacco: Never Used  Substance Use Topics  . Alcohol use: No  . Drug use: No    Home Medications Prior to Admission medications   Medication Sig  Start Date End Date Taking? Authorizing Provider  cyclobenzaprine (FLEXERIL) 10 MG tablet Take 1 tablet (10 mg total) by mouth 2 (two) times daily as needed for muscle spasms. 03/08/19   Khatri, Hina, PA-C  HYDROcodone-acetaminophen (NORCO/VICODIN) 5-325 MG tablet Take 1 tablet by mouth every 4 (four) hours as needed for moderate pain. 03/05/19 03/04/20  Elson Areas, PA-C  lidocaine (LIDODERM) 5 % Place 1 patch onto the skin daily. Remove & Discard patch within 12 hours or as directed by MD 03/08/19   Dietrich Pates, PA-C  methocarbamol (ROBAXIN) 500 MG tablet Take 1 tablet (500 mg total) by mouth at bedtime as needed for muscle spasms. 12/12/17   Caccavale, Sophia, PA-C  predniSONE (DELTASONE) 10 MG tablet 6 tablets 1st day, 5 tablets 2nd day, 4 tablets 3rd day, 3 tablets 4th day, 2 tablets 5th day and 1 tablet 6th day 03/05/19   Elson Areas, PA-C  pregabalin (LYRICA) 75 MG capsule Take 1 capsule (75 mg total) by mouth 2 (two) times daily. 02/27/17   Maczis, Elmer Sow, PA-C    Allergies    Patient has no known allergies.  Review of Systems   Review of Systems  Musculoskeletal: Positive for back pain.  Neurological: Positive for weakness and paresthesias. Negative for tingling.    Physical Exam Updated Vital Signs BP Marland Kitchen)  164/107 (BP Location: Right Arm)   Pulse 77   Temp 98.3 F (36.8 C) (Oral)   Resp 18   SpO2 100%   Physical Exam Vitals and nursing note reviewed.  Constitutional:      Appearance: Normal appearance. He is well-developed.     Comments: Sitting in wheel chair.   HENT:     Head: Normocephalic and atraumatic.  Eyes:     General: No scleral icterus.    Pupils: Pupils are equal, round, and reactive to light.  Cardiovascular:     Heart sounds: Normal heart sounds.  Pulmonary:     Effort: Pulmonary effort is normal.     Breath sounds: Normal breath sounds. No wheezing.  Chest:     Chest wall: No tenderness.  Abdominal:     General: Bowel sounds are normal.  There is no distension.     Palpations: Abdomen is soft.     Tenderness: There is no abdominal tenderness.  Musculoskeletal:        General: No tenderness or deformity.     Cervical back: Normal range of motion.  Skin:    General: Skin is warm and dry.  Neurological:     Mental Status: He is alert and oriented to person, place, and time.     Comments: RLE- KF,KE 5/5 strength LLE- HF, HE 5/5 strength Normal gait. No pronator drift. No leg drop.  Patellar reflexes present and symmetric. CN I, II and VIII not tested. CN II-XII grossly intact bilaterally.        ED Results / Procedures / Treatments   Labs (all labs ordered are listed, but only abnormal results are displayed) Labs Reviewed - No data to display  EKG None  Radiology MR LUMBAR SPINE WO CONTRAST  Result Date: 03/09/2019 CLINICAL DATA:  Low back pain, trauma. Additional history provided: Patient reports he has low back pain that radiates down right leg causing him difficulty walking. EXAM: MRI LUMBAR SPINE WITHOUT CONTRAST TECHNIQUE: Multiplanar, multisequence MR imaging of the lumbar spine was performed. No intravenous contrast was administered. COMPARISON:  Lumbar spine MRI 03/27/2017, lumbar spine radiographs 03/18/2013 FINDINGS: Segmentation:  5 lumbar vertebrae. Alignment: Straightening of the expected lumbar lordosis. Trace L5-S1 retrolisthesis Vertebrae: Vertebral body height is maintained. Again demonstrated are multiple T1/T2 hyperintense lesions within the lumbar vertebral bodies and sacrum consistent with hemangiomata. No suspicious osseous lesion or marrow edema. Conus medullaris and cauda equina: Conus extends to the L1-L2 level. No signal abnormality within the visualized distal spinal cord. Paraspinal and other soft tissues: No abnormality identified within included portions of the abdomen/retroperitoneum. Paraspinal soft tissues within normal limits. Disc levels: Unless otherwise stated, the level by level  findings below have not significantly changed since prior MRI 03/27/2017. Moderate disc degeneration at T12-L1, L4-L5 and L5-S1. Mild disc degeneration at the remaining levels. T11-T12: This level is not included on axial imaging. Small left center disc protrusion without significant spinal canal stenosis. T12-L1: This level is not included on axial imaging. Minimal disc bulge. No significant spinal canal or neural foraminal narrowing. L1-L2: No disc herniation. No significant canal or foraminal stenosis. L2-L3: Minimal disc bulge. No significant spinal canal or neural foraminal narrowing. L3-L4: Central posterior annular fissure. Small disc bulge. Mild facet arthrosis/ligamentum flavum hypertrophy. Minimal bilateral subarticular narrowing without significant central canal stenosis. Mild bilateral neural foraminal narrowing. L4-L5: Small to moderate-sized central disc protrusion eccentric to the left. Mild facet arthrosis/ligamentum flavum hypertrophy. As before, the disc protrusion contributes to bilateral subarticular narrowing (  greater on the left) with contact upon descending left-sided nerve roots. Mild central canal stenosis. No significant neural foraminal narrowing. L5-S1: Disc bulge with endplate spurring. The right center/subarticular component of a previously demonstrated disc herniation has significantly progressed, now with a large posterior annular fissure at this site and extruded disc material migrating cephalad to the mid/upper L5 vertebral body level. This disc extrusion encroaches upon the descending and exiting L5 nerve root at the L5 vertebral body level. It also encroaches upon additional descending right-sided nerve roots, particularly the descending right S1 nerve root. A left center/subarticular disc protrusion has not significantly changed. Mild facet arthrosis/ligamentum flavum hypertrophy. Persistent left subarticular stenosis with crowding of the descending left S1 nerve root. Mild  narrowing of the central canal. No significant neural foraminal narrowing. IMPRESSION: At L5-S1, right center/subarticular cranially migrated disc extrusion encroaching upon the descending and exiting right L5 nerve root. This disc extrusion encroaches upon multiple additional descending right-sided nerve roots, particularly the descending right S1 nerve root. Sequestered disc material cannot be excluded. Unchanged left center/subarticular disc protrusion at this level contributing to left subarticular stenosis with crowding of the descending left S1 nerve root. At L4-L5, unchanged central disc protrusion eccentric to the left. As before, the disc protrusion contributes to mild bilateral subarticular narrowing (greater on the left) with contact upon descending left-sided nerve roots. Unchanged spondylosis at the remaining levels with no more than mild spinal canal or neural foraminal narrowing. Electronically Signed   By: Jackey LogeKyle  Golden DO   On: 03/09/2019 12:56    Procedures Procedures (including critical care time)  Medications Ordered in ED Medications  HYDROmorphone (DILAUDID) injection 1 mg (1 mg Intramuscular Given 03/09/19 1151)    ED Course  I have reviewed the triage vital signs and the nursing notes.  Pertinent labs & imaging results that were available during my care of the patient were reviewed by me and considered in my medical decision making (see chart for details).    MDM Rules/Calculators/A&P  Patient with a past medical history of degenerative disc disease presents the ED with complaints of lower back pain, seen in the ED for his third visit in the past 4 days.  She reports medications that have been tried such as prednisone, Flexeril, Percocet have not been helping his symptoms.  He reports the pain is mostly along the lumbar spine, this radiates down his right leg causing weakness, worse with ambulation.  Does not have any red flags such as fever, IV drug use, previous history of  cancer, bowel or bladder difficulties. Patient has attempted to follow-up with neurology, unfortunately is unable to schedule his MRI at this time.  He is currently missing several days at work.  He reports the pain has not been controlled even with his dedication.  Exam is unchanged from yesterday's visit, no weakness, however pain with extension and flexion of right leg.  Provide patient with pain medication, will also obtain MRI as patient currently reports unable to get in before the year.  He is otherwise hemodynamically stable, provided with IM Dilaudid 1mg .  MRI lumbar spine without contrast showed: At L5-S1, right center/subarticular cranially migrated disc  extrusion encroaching upon the descending and exiting right L5 nerve  root. This disc extrusion encroaches upon multiple additional  descending right-sided nerve roots, particularly the descending  right S1 nerve root. Sequestered disc material cannot be excluded.  Unchanged left center/subarticular disc protrusion at this level  contributing to left subarticular stenosis with crowding of the  descending left S1 nerve root.    At L4-L5, unchanged central disc protrusion eccentric to the left.  As before, the disc protrusion contributes to mild bilateral  subarticular narrowing (greater on the left) with contact upon  descending left-sided nerve roots.    Unchanged spondylosis at the remaining levels with no more than mild  spinal canal or neural foraminal narrowing.   These results were discussed at length with patient, will attempt neurosurgery consult in order to get patient an appointment scheduled for further management of this chronic lumbar pain.  2:15 PM spoke to Dr. Trenton Gammon at [facility] who will see patient in office on Tuesday.  Patient has been provided with a copy of his study on a disc, he will also be provided with the report for his MRI to take to his appointment on Tuesday.  We will provide him with a  work note in the meantime.  He does have medication at home to help with his pain.  Patient understands and agrees with management, return precautions discussed at length.   Portions of this note were generated with [employer]obby[employer]. Dictation errors may occur despite best attempts at proofreading.  Final Clinical Impression(s) / ED Diagnoses Final diagnoses:  Protrusion of lumbar intervertebral disc  Sciatica of right side    Rx / DC Orders ED Discharge Orders    None       Janeece Fitting, PA-C 03/09/19 1417    Tegeler, Gwenyth Allegra, MD 03/09/19 1728

## 2019-03-09 NOTE — Discharge Instructions (Addendum)
I have provided a copy of your MRI today.  You were also provided with a disc which contains the images from your study today.

## 2021-02-01 ENCOUNTER — Emergency Department (HOSPITAL_COMMUNITY): Payer: BC Managed Care – PPO

## 2021-02-01 ENCOUNTER — Emergency Department (HOSPITAL_COMMUNITY)
Admission: EM | Admit: 2021-02-01 | Discharge: 2021-02-01 | Disposition: A | Discharge status: Home / Self Care | Payer: BC Managed Care – PPO | Attending: Emergency Medicine | Admitting: Emergency Medicine

## 2021-02-01 ENCOUNTER — Encounter (HOSPITAL_COMMUNITY): Payer: Self-pay

## 2021-02-01 DIAGNOSIS — J45909 Unspecified asthma, uncomplicated: Secondary | ICD-10-CM | POA: Diagnosis not present

## 2021-02-01 DIAGNOSIS — J449 Chronic obstructive pulmonary disease, unspecified: Secondary | ICD-10-CM | POA: Diagnosis not present

## 2021-02-01 DIAGNOSIS — Z20822 Contact with and (suspected) exposure to covid-19: Secondary | ICD-10-CM | POA: Diagnosis not present

## 2021-02-01 DIAGNOSIS — R059 Cough, unspecified: Secondary | ICD-10-CM | POA: Diagnosis present

## 2021-02-01 DIAGNOSIS — J189 Pneumonia, unspecified organism: Secondary | ICD-10-CM | POA: Diagnosis not present

## 2021-02-01 LAB — RESP PANEL BY RT-PCR (FLU A&B, COVID) ARPGX2
Influenza A by PCR: NEGATIVE
Influenza B by PCR: NEGATIVE
SARS Coronavirus 2 by RT PCR: NEGATIVE

## 2021-02-01 MED ORDER — AZITHROMYCIN 250 MG PO TABS: ORAL_TABLET | ORAL | 0 refills | Status: AC

## 2021-02-01 MED ORDER — AMOXICILLIN 500 MG PO TABS: 1000.0000 mg | ORAL_TABLET | Freq: Three times a day (TID) | ORAL | 0 refills | Status: AC

## 2021-02-01 NOTE — ED Provider Notes (Signed)
Emergency Medicine Provider Triage Evaluation Note  Shaun Steele , a 60 y.o. male  was evaluated in triage.  Pt complains of cough.  Review of Systems  Positive: Productive cough Negative: Fever, chills, sob, n/v/d, congestion, sore throat  Physical Exam  BP (!) 142/97   Pulse 64   Temp 98.6 F (37 C)   Resp 19   SpO2 99%  Gen:   Awake, no distress   Resp:  Normal effort  MSK:   Moves extremities without difficulty  Other:    Medical Decision Making  Medically screening exam initiated at 3:10 PM.  Appropriate orders placed.  Shaun Steele was informed that the remainder of the evaluation will be completed by another provider, this initial triage assessment does not replace that evaluation, and the importance of remaining in the ED until their evaluation is complete.  Report productive cough x 3 days, son has similar sxs.  No other complaints.     Shaun Helper, PA-C 02/01/21 1517    Tegeler, Canary Brim, MD 02/01/21 [phone]

## 2021-02-01 NOTE — ED Triage Notes (Signed)
Pt c/o productive cough x 3 days. Denies any other symptoms

## 2021-02-01 NOTE — ED Provider Notes (Signed)
[facility]   CSN: [id number] Arrival date & time: 02/01/21  1352     History Chief Complaint  Patient presents with   Cough    Shaun Steele is a 60 y.o. male.  The history is provided by the patient. No language interpreter was used.  Cough  60 year old male presenting complaining of cough.  Patient report for the past 3 days he has had cough productive with green sputum.  States his son is having similar symptoms.  He does not endorse fever or chills no nausea vomiting diarrhea no congestion sneezing sore throat or shortness of breath.  He denies any specific treatment tried.  No sick History of asthma or COPD.  No history of tobacco abuse.  Past Medical History:  Diagnosis Date   Arthritis    Umbilical hernia     Patient Active Problem List   Diagnosis Date Noted   Arthritis of right knee 08/22/2013   Left groin pain 05/22/2013   Inguinal hernia, left. 04/17/2013    Past Surgical History:  Procedure Laterality Date   HERNIA REPAIR         Family History  Problem Relation Age of Onset   Diabetes Mother    Diabetes Father    Diabetes Brother    Diabetes Maternal Grandfather     Social History   Tobacco Use   Smoking status: Never   Smokeless tobacco: Never  Substance Use Topics   Alcohol use: No   Drug use: No    Home Medications Prior to Admission medications   Medication Sig Start Date End Date Taking? Authorizing Provider  cyclobenzaprine (FLEXERIL) 10 MG tablet Take 1 tablet (10 mg total) by mouth 2 (two) times daily as needed for muscle spasms. 03/08/19   Khatri, Hina, PA-C  lidocaine (LIDODERM) 5 % Place 1 patch onto the skin daily. Remove & Discard patch within 12 hours or as directed by MD 03/08/19   Dietrich Pates, PA-C  methocarbamol (ROBAXIN) 500 MG tablet Take 1 tablet (500 mg total) by mouth at bedtime as needed for muscle spasms. 12/12/17   Caccavale, Sophia, PA-C  predniSONE (DELTASONE)  10 MG tablet 6 tablets 1st day, 5 tablets 2nd day, 4 tablets 3rd day, 3 tablets 4th day, 2 tablets 5th day and 1 tablet 6th day 03/05/19   Elson Areas, PA-C  pregabalin (LYRICA) 75 MG capsule Take 1 capsule (75 mg total) by mouth 2 (two) times daily. 02/27/17   Maczis, Elmer Sow, PA-C    Allergies    Patient has no known allergies.  Review of Systems   Review of Systems  Respiratory:  Positive for cough.   All other systems reviewed and are negative.  Physical Exam Updated Vital Signs BP (!) 142/97   Pulse 64   Temp 98.6 F (37 C)   Resp 19   SpO2 99%   Physical Exam Vitals and nursing note reviewed.  Constitutional:      General: He is not in acute distress.    Appearance: He is well-developed.  HENT:     Head: Atraumatic.  Eyes:     Conjunctiva/sclera: Conjunctivae normal.  Cardiovascular:     Rate and Rhythm: Normal rate and regular rhythm.     Pulses: Normal pulses.     Heart sounds: Normal heart sounds.  Pulmonary:     Effort: Pulmonary effort is normal.     Breath sounds: Normal breath sounds. No wheezing, rhonchi or rales.  Abdominal:  Palpations: Abdomen is soft.  Musculoskeletal:     Cervical back: Neck supple.     Right lower leg: No edema.     Left lower leg: No edema.  Skin:    Findings: No rash.  Neurological:     Mental Status: He is alert. Mental status is at baseline.    ED Results / Procedures / Treatments   Labs (all labs ordered are listed, but only abnormal results are displayed) Labs Reviewed  RESP PANEL BY RT-PCR (FLU A&B, COVID) [id number]    EKG None  Radiology DG Chest Portable 1 View  Result Date: 02/01/2021 CLINICAL DATA:  Cough EXAM: PORTABLE CHEST 1 VIEW COMPARISON:  None. FINDINGS: Cardiac contours within normal limits for AP technique. Tortuosity of the thoracic aorta, likely age related. Mild opacity of the left costophrenic angle. No large pleural effusion or evidence of pneumothorax. IMPRESSION: Mild opacity of the  left costophrenic angle, possibly due to atelectasis, infection or aspiration are additional considerations. PA and lateral chest x-ray could assist with further evaluation. Electronically Signed   By: Allegra Lai M.D.   On: 02/01/2021 15:32    Procedures Procedures   Medications Ordered in ED Medications - No data to display  ED Course  I have reviewed the triage vital signs and the nursing notes.  Pertinent labs & imaging results that were available during my care of the patient were reviewed by me and considered in my medical decision making (see chart for details).    MDM Rules/Calculators/A&P                           BP (!) 142/97   Pulse 64   Temp 98.6 F (37 C)   Resp 19   SpO2 99%   Final Clinical Impression(s) / ED Diagnoses Final diagnoses:  Community acquired pneumonia of left lung, unspecified part of lung    Rx / DC Orders ED Discharge Orders          Ordered    azithromycin (ZITHROMAX Z-PAK) 250 MG tablet        02/01/21 1655    amoxicillin (AMOXIL) 500 MG tablet  3 times daily        02/01/21 1655           4:41 PM Patient here with productive cough for the past 3 days.  Recent sick contact.  Patient overall well-appearing, viral respiratory panel negative for infection, chest x-ray shows mild opacity in the left costophrenic angle possibly due to atelectasis, infection, or aspiration.  Since he does endorse productive cough, it would be reasonable to treat for community acquired pneumonia with high-dose amoxicillin and zpak.     Fayrene Helper, PA-C 02/01/21 1656    Tegeler, Canary Brim, MD 02/01/21 [phone]

## 2021-02-20 ENCOUNTER — Other Ambulatory Visit: Payer: Self-pay

## 2021-02-20 ENCOUNTER — Emergency Department (HOSPITAL_BASED_OUTPATIENT_CLINIC_OR_DEPARTMENT_OTHER)
Admission: EM | Admit: 2021-02-20 | Discharge: 2021-02-20 | Discharge status: Against Medical Advice | Payer: BC Managed Care – PPO | Source: Home / Self Care

## 2021-02-21 ENCOUNTER — Other Ambulatory Visit: Payer: Self-pay

## 2021-02-21 ENCOUNTER — Encounter (HOSPITAL_BASED_OUTPATIENT_CLINIC_OR_DEPARTMENT_OTHER): Payer: Self-pay

## 2021-02-21 ENCOUNTER — Emergency Department (HOSPITAL_BASED_OUTPATIENT_CLINIC_OR_DEPARTMENT_OTHER): Payer: BC Managed Care – PPO | Admitting: Radiology

## 2021-02-21 ENCOUNTER — Emergency Department (HOSPITAL_BASED_OUTPATIENT_CLINIC_OR_DEPARTMENT_OTHER)
Admission: EM | Admit: 2021-02-21 | Discharge: 2021-02-21 | Disposition: A | Discharge status: Home / Self Care | Payer: BC Managed Care – PPO | Attending: Emergency Medicine | Admitting: Emergency Medicine

## 2021-02-21 DIAGNOSIS — R059 Cough, unspecified: Secondary | ICD-10-CM | POA: Diagnosis present

## 2021-02-21 NOTE — ED Triage Notes (Signed)
Patient here POV from Home with Cough.  Patient was seen at [facility] approximately 3 weeks PTA and given Antibiotics for possible PNA. Cough cleared up but now Patient is having Productive Cough that began 3 days PTA again.  No Fevers but Patient complains of Mild to Moderate Weakness.  NAD Noted during Triage. Ambulatory. A&Ox4. GCS 15.

## 2021-02-21 NOTE — Discharge Instructions (Signed)
No evidence of new pneumonia seen on your chest x-Shaun Steele today. Please return if you are having shortness of breath or fever Please follow-up with your doctor this week.

## 2021-02-21 NOTE — ED Provider Notes (Signed)
[facility] EMERGENCY DEPT Provider Note   CSN: [id number] Arrival date & time: 02/21/21  1110     History Chief Complaint  Patient presents with   Cough    Jaciel Diem is a 60 y.o. male.  HPI 60 year old male presents today with chief complaint of cough.  He states that he was seen at the [facility] emergency department approximately 3 weeks ago at that time was diagnosed with pneumonia.  He says he was started on antibiotics and completed those.  During that time his symptoms improved.  He states after he was off the antibiotics for several days, he began having cough again.  He reports having a cough again for approximately 3 days.  He denies fever, chills, productive cough, nausea, vomiting, diarrhea, or chest pain.  He is not dyspneic.  He is not a smoker.  Denies any history of DVT or PE.  Patient reports being up-to-date on his COVID and flu vaccine     Past Medical History:  Diagnosis Date   Arthritis    Umbilical hernia     Patient Active Problem List   Diagnosis Date Noted   Arthritis of right knee 08/22/2013   Left groin pain 05/22/2013   Inguinal hernia, left. 04/17/2013    Past Surgical History:  Procedure Laterality Date   HERNIA REPAIR         Family History  Problem Relation Age of Onset   Diabetes Mother    Diabetes Father    Diabetes Brother    Diabetes Maternal Grandfather     Social History   Tobacco Use   Smoking status: Never   Smokeless tobacco: Never  Substance Use Topics   Alcohol use: No   Drug use: No    Home Medications Prior to Admission medications   Medication Sig Start Date End Date Taking? Authorizing Provider  amoxicillin (AMOXIL) 500 MG tablet Take 2 tablets (1,000 mg total) by mouth in the morning, at noon, and at bedtime. 02/01/21   Fayrene Helper, PA-C  azithromycin (ZITHROMAX Z-PAK) 250 MG tablet 2 po day one, then 1 daily x 4 days 02/01/21   Fayrene Helper, PA-C  cyclobenzaprine (FLEXERIL) 10 MG tablet  Take 1 tablet (10 mg total) by mouth 2 (two) times daily as needed for muscle spasms. 03/08/19   Khatri, Hina, PA-C  lidocaine (LIDODERM) 5 % Place 1 patch onto the skin daily. Remove & Discard patch within 12 hours or as directed by MD 03/08/19   Dietrich Pates, PA-C  methocarbamol (ROBAXIN) 500 MG tablet Take 1 tablet (500 mg total) by mouth at bedtime as needed for muscle spasms. 12/12/17   Caccavale, Sophia, PA-C  predniSONE (DELTASONE) 10 MG tablet 6 tablets 1st day, 5 tablets 2nd day, 4 tablets 3rd day, 3 tablets 4th day, 2 tablets 5th day and 1 tablet 6th day 03/05/19   Elson Areas, PA-C  pregabalin (LYRICA) 75 MG capsule Take 1 capsule (75 mg total) by mouth 2 (two) times daily. 02/27/17   Maczis, Elmer Sow, PA-C    Allergies    Patient has no known allergies.  Review of Systems   Review of Systems  All other systems reviewed and are negative.  Physical Exam Updated Vital Signs BP (!) 152/88 (BP Location: Right Arm)   Pulse 89   Temp 98.1 F (36.7 C) (Oral)   Resp 16   Ht 1.88 m (6\' 2" )   Wt 95.3 kg   SpO2 99%   BMI 26.98 kg/m  Physical Exam Vitals and nursing note reviewed.  Constitutional:      Appearance: Normal appearance.  HENT:     Head: Normocephalic and atraumatic.     Right Ear: External ear normal.     Left Ear: External ear normal.     Nose: Nose normal.     Mouth/Throat:     Mouth: Mucous membranes are moist.  Eyes:     Pupils: Pupils are equal, round, and reactive to light.  Cardiovascular:     Rate and Rhythm: Normal rate and regular rhythm.     Pulses: Normal pulses.  Pulmonary:     Effort: Pulmonary effort is normal.     Breath sounds: Normal breath sounds.  Abdominal:     General: Abdomen is flat.     Palpations: Abdomen is soft.  Musculoskeletal:        General: Normal range of motion.     Cervical back: Normal range of motion.  Skin:    General: Skin is warm and dry.     Capillary Refill: Capillary refill takes less than 2 seconds.   Neurological:     General: No focal deficit present.     Mental Status: He is alert.  Psychiatric:        Mood and Affect: Mood normal.    ED Results / Procedures / Treatments   Labs (all labs ordered are listed, but only abnormal results are displayed) Labs Reviewed - No data to display  EKG None  Radiology DG Chest 2 View  Result Date: 02/21/2021 CLINICAL DATA:  Cough. EXAM: CHEST - 2 VIEW COMPARISON:  02/01/2021 FINDINGS: Small densities at the left lung base appear chronic. Otherwise, lungs are clear without focal airspace disease. Heart and mediastinum are within normal limits. No large pleural effusions. No acute bone abnormality. IMPRESSION: No active cardiopulmonary disease. Electronically Signed   By: Richarda Overlie M.D.   On: 02/21/2021 12:37    Procedures Procedures   Medications Ordered in ED Medications - No data to display  ED Course  I have reviewed the triage vital signs and the nursing notes.  Pertinent labs & imaging results that were available during my care of the patient were reviewed by me and considered in my medical decision making (see chart for details).    MDM Rules/Calculators/A&P                         Patient with recent diagnosis of pneumonia.  He improved after antibiotics was began having a coughing in the past 3 days.  Denies shortness of breath or fever.  Cough is nonproductive.  Chest x-Cia Garretson was repeated and there is no evidence of pneumonia.  Patient appears stable for discharge. Final Clinical Impression(s) / ED Diagnoses Final diagnoses:  Cough, unspecified type    Rx / DC Orders ED Discharge Orders     None        Margarita Grizzle, MD 02/21/21 1243

## 2021-02-21 NOTE — ED Notes (Signed)
RN provided AVS using Teachback Method. Patient verbalizes understanding of Discharge Instructions. Opportunity for Questioning and Answers were provided by RN. Patient Discharged from ED ambulatory to Home via Self.  

## 2021-04-04 ENCOUNTER — Emergency Department (HOSPITAL_BASED_OUTPATIENT_CLINIC_OR_DEPARTMENT_OTHER)
Admission: EM | Admit: 2021-04-04 | Discharge: 2021-04-04 | Disposition: A | Discharge status: Home / Self Care | Payer: BC Managed Care – PPO | Attending: Emergency Medicine | Admitting: Emergency Medicine

## 2021-04-04 ENCOUNTER — Encounter (HOSPITAL_BASED_OUTPATIENT_CLINIC_OR_DEPARTMENT_OTHER): Payer: Self-pay

## 2021-04-04 ENCOUNTER — Other Ambulatory Visit: Payer: Self-pay

## 2021-04-04 ENCOUNTER — Emergency Department (HOSPITAL_BASED_OUTPATIENT_CLINIC_OR_DEPARTMENT_OTHER): Payer: BC Managed Care – PPO | Admitting: Radiology

## 2021-04-04 DIAGNOSIS — Z20822 Contact with and (suspected) exposure to covid-19: Secondary | ICD-10-CM | POA: Diagnosis not present

## 2021-04-04 DIAGNOSIS — R051 Acute cough: Secondary | ICD-10-CM | POA: Insufficient documentation

## 2021-04-04 LAB — RESP PANEL BY RT-PCR (FLU A&B, COVID) ARPGX2
Influenza A by PCR: NEGATIVE
Influenza B by PCR: NEGATIVE
SARS Coronavirus 2 by RT PCR: NEGATIVE

## 2021-04-04 LAB — GROUP A STREP BY PCR: Group A Strep by PCR: NOT DETECTED

## 2021-04-04 MED ORDER — PHENOL 1.4 % MT LIQD
1.0000 | OROMUCOSAL | 0 refills | Status: AC | PRN
Start: 2021-04-04 — End: ?

## 2021-04-04 MED ORDER — BENZONATATE 100 MG PO CAPS: 100.0000 mg | ORAL_CAPSULE | Freq: Three times a day (TID) | ORAL | 0 refills | Status: AC

## 2021-04-04 NOTE — Discharge Instructions (Addendum)
Your x-ray did not show any evidence for pneumonia.  You likely have a viral upper respiratory infection such as the common cold.  I recommend that you take the Tessalon as directed for your cough.  Have also given you some throat spray to help with your sore throat.  You can drink tea with honey to help your symptoms as well as Cough drops and gargling with warm salt water.  Additionally can sleep with a humidifier in your room to help with your symptoms.  I recommend that you follow-up with your regular doctor in 1 week for reassessment of your symptoms and return to the emergency department for any new or worsening symptoms immediately.

## 2021-04-04 NOTE — ED Triage Notes (Signed)
Pt c/o dry cough and sore throat x 2 days. Denies fever.

## 2021-04-04 NOTE — ED Provider Notes (Addendum)
[facility] EMERGENCY DEPT Provider Note   CSN: [id number] Arrival date & time: 04/04/21  1833     History  Chief Complaint  Patient presents with   Cough    Shaun Steele is a 61 y.o. male.  HPI   61 y/o male with a h/o arthritis, who presents to the ED today for eval of a cough for the last several days.  He also reports a sore throat.  He is tried cough drops at home without relief.  He is also tried NyQuil and over-the-counter cough medication without relief.  He said no fevers, chills, rhinorrhea, congestion, chest pain or shortness of breath.  He denies any sick contacts.  He does not smoke.  Home Medications Prior to Admission medications   Medication Sig Start Date End Date Taking? Authorizing Provider  benzonatate (TESSALON) 100 MG capsule Take 1 capsule (100 mg total) by mouth every 8 (eight) hours for 5 days. 04/04/21 04/09/21 Yes Enedelia Martorelli S, PA-C  phenol (CHLORASEPTIC) 1.4 % LIQD Use as directed 1 spray in the mouth or throat as needed for throat irritation / pain. 04/04/21  Yes Marisel Tostenson S, PA-C  amoxicillin (AMOXIL) 500 MG tablet Take 2 tablets (1,000 mg total) by mouth in the morning, at noon, and at bedtime. 02/01/21   Domenic Moras, PA-C  azithromycin (ZITHROMAX Z-PAK) 250 MG tablet 2 po day one, then 1 daily x 4 days 02/01/21   Domenic Moras, PA-C  cyclobenzaprine (FLEXERIL) 10 MG tablet Take 1 tablet (10 mg total) by mouth 2 (two) times daily as needed for muscle spasms. 03/08/19   Khatri, Hina, PA-C  lidocaine (LIDODERM) 5 % Place 1 patch onto the skin daily. Remove & Discard patch within 12 hours or as directed by MD 03/08/19   Delia Heady, PA-C  methocarbamol (ROBAXIN) 500 MG tablet Take 1 tablet (500 mg total) by mouth at bedtime as needed for muscle spasms. 12/12/17   Caccavale, Sophia, PA-C  predniSONE (DELTASONE) 10 MG tablet 6 tablets 1st day, 5 tablets 2nd day, 4 tablets 3rd day, 3 tablets 4th day, 2 tablets 5th day and 1 tablet 6th day  03/05/19   Fransico Meadow, PA-C  pregabalin (LYRICA) 75 MG capsule Take 1 capsule (75 mg total) by mouth 2 (two) times daily. 02/27/17   Maczis, Barth Kirks, PA-C      Allergies    Patient has no known allergies.    Review of Systems   Review of Systems  Constitutional:  Negative for fever.  HENT:  Negative for congestion and rhinorrhea.   Respiratory:  Positive for cough. Negative for shortness of breath.   Cardiovascular:  Negative for chest pain.   Physical Exam Updated Vital Signs BP 136/86 (BP Location: Right Arm)    Pulse 86    Temp 98.8 F (37.1 C) (Oral)    Resp 20    Ht 6\' 3"  (1.905 m)    Wt 95.3 kg    SpO2 99%    BMI 26.25 kg/m  Physical Exam Constitutional:      General: He is not in acute distress.    Appearance: He is well-developed.  HENT:     Mouth/Throat:     Pharynx: No oropharyngeal exudate or posterior oropharyngeal erythema.     Tonsils: No tonsillar exudate or tonsillar abscesses. 0 on the right. 0 on the left.  Eyes:     Conjunctiva/sclera: Conjunctivae normal.  Cardiovascular:     Rate and Rhythm: Normal rate and regular rhythm.  Heart sounds: Normal heart sounds.  Pulmonary:     Effort: Pulmonary effort is normal. No respiratory distress.     Breath sounds: Normal breath sounds. No wheezing, rhonchi or rales.  Abdominal:     Palpations: Abdomen is soft.     Tenderness: There is no abdominal tenderness.  Skin:    General: Skin is warm and dry.  Neurological:     Mental Status: He is alert and oriented to person, place, and time.    ED Results / Procedures / Treatments   Labs (all labs ordered are listed, but only abnormal results are displayed) Labs Reviewed  RESP PANEL BY RT-PCR (FLU A&B, COVID) [id number]  GROUP A STREP BY PCR    EKG None  Radiology No results found.    Procedures Procedures    Medications Ordered in ED Medications - No data to display  ED Course/ Medical Decision Making/ A&P                           Medical  Decision Making  Pt CXR negative for acute infiltrate. Patients symptoms are consistent with URI, likely viral etiology. Covid/flu/strep negative. antibiotics are not indicated for viral infections. Pt will be discharged with symptomatic treatment.  Verbalizes understanding and is agreeable with plan. Pt is hemodynamically stable & in NAD prior to dc.  Final Clinical Impression(s) / ED Diagnoses Final diagnoses:  Acute cough    Rx / DC Orders ED Discharge Orders          Ordered    benzonatate (TESSALON) 100 MG capsule  Every 8 hours        04/04/21 2149    phenol (CHLORASEPTIC) 1.4 % LIQD  As needed        04/04/21 2149              Rodney Booze, PA-C 04/04/21 2151    Rodney Booze, PA-C 04/04/21 2155    Wyvonnia Dusky, MD 04/04/21 [phone]

## 2022-06-22 ENCOUNTER — Emergency Department (HOSPITAL_BASED_OUTPATIENT_CLINIC_OR_DEPARTMENT_OTHER): Payer: BC Managed Care – PPO

## 2022-06-22 ENCOUNTER — Emergency Department (HOSPITAL_BASED_OUTPATIENT_CLINIC_OR_DEPARTMENT_OTHER)
Admission: EM | Admit: 2022-06-22 | Discharge: 2022-06-22 | Disposition: A | Discharge status: Home / Self Care | Payer: BC Managed Care – PPO | Attending: Emergency Medicine | Admitting: Emergency Medicine

## 2022-06-22 ENCOUNTER — Encounter (HOSPITAL_BASED_OUTPATIENT_CLINIC_OR_DEPARTMENT_OTHER): Payer: Self-pay | Admitting: Emergency Medicine

## 2022-06-22 ENCOUNTER — Other Ambulatory Visit: Payer: Self-pay

## 2022-06-22 DIAGNOSIS — R1031 Right lower quadrant pain: Secondary | ICD-10-CM | POA: Diagnosis present

## 2022-06-22 DIAGNOSIS — K429 Umbilical hernia without obstruction or gangrene: Secondary | ICD-10-CM | POA: Diagnosis not present

## 2022-06-22 LAB — CBC WITH DIFFERENTIAL/PLATELET
Abs Immature Granulocytes: 0.01 10*3/uL (ref 0.00–0.07)
Basophils Absolute: 0 10*3/uL (ref 0.0–0.1)
Basophils Relative: 1 %
Eosinophils Absolute: 0.3 10*3/uL (ref 0.0–0.5)
Eosinophils Relative: 7 %
HCT: 38.9 % — ABNORMAL LOW (ref 39.0–52.0)
Hemoglobin: 13.3 g/dL (ref 13.0–17.0)
Immature Granulocytes: 0 %
Lymphocytes Relative: 43 %
Lymphs Abs: 1.6 10*3/uL (ref 0.7–4.0)
MCH: 29.8 pg (ref 26.0–34.0)
MCHC: 34.2 g/dL (ref 30.0–36.0)
MCV: 87.2 fL (ref 80.0–100.0)
Monocytes Absolute: 0.2 10*3/uL (ref 0.1–1.0)
Monocytes Relative: 6 %
Neutro Abs: 1.6 10*3/uL — ABNORMAL LOW (ref 1.7–7.7)
Neutrophils Relative %: 43 %
Platelets: 240 10*3/uL (ref 150–400)
RBC: 4.46 MIL/uL (ref 4.22–5.81)
RDW: 12.3 % (ref 11.5–15.5)
WBC: 3.8 10*3/uL — ABNORMAL LOW (ref 4.0–10.5)
nRBC: 0 % (ref 0.0–0.2)

## 2022-06-22 LAB — COMPREHENSIVE METABOLIC PANEL
ALT: 18 U/L (ref 0–44)
AST: 16 U/L (ref 15–41)
Albumin: 4.1 g/dL (ref 3.5–5.0)
Alkaline Phosphatase: 58 U/L (ref 38–126)
Anion gap: 7 (ref 5–15)
BUN: 11 mg/dL (ref 8–23)
CO2: 27 mmol/L (ref 22–32)
Calcium: 9.5 mg/dL (ref 8.9–10.3)
Chloride: 104 mmol/L (ref 98–111)
Creatinine, Ser: 0.74 mg/dL (ref 0.61–1.24)
GFR, Estimated: >60 mL/min (ref >60)
Glucose, Bld: 122 mg/dL — ABNORMAL HIGH (ref 70–99)
Potassium: 4.2 mmol/L (ref 3.5–5.1)
Sodium: 138 mmol/L (ref 135–145)
Total Bilirubin: 0.7 mg/dL (ref 0.3–1.2)
Total Protein: 6.9 g/dL (ref 6.5–8.1)

## 2022-06-22 LAB — URINALYSIS, ROUTINE W REFLEX MICROSCOPIC
Bilirubin Urine: NEGATIVE
Glucose, UA: NEGATIVE mg/dL
Hgb urine dipstick: NEGATIVE
Ketones, ur: NEGATIVE mg/dL
Leukocytes,Ua: NEGATIVE
Nitrite: NEGATIVE
Protein, ur: NEGATIVE mg/dL
Specific Gravity, Urine: 1.006 (ref 1.005–1.030)
pH: 7 (ref 5.0–8.0)

## 2022-06-22 LAB — LIPASE, BLOOD: Lipase: 72 U/L — ABNORMAL HIGH (ref 11–51)

## 2022-06-22 LAB — LACTIC ACID, PLASMA: Lactic Acid, Venous: 0.7 mmol/L (ref 0.5–1.9)

## 2022-06-22 MED ORDER — ONDANSETRON 4 MG PO TBDP: 4.0000 mg | ORAL_TABLET | Freq: Three times a day (TID) | ORAL | 0 refills | Status: AC | PRN

## 2022-06-22 MED ORDER — OXYCODONE HCL 5 MG PO TABS: 2.5000 mg | ORAL_TABLET | ORAL | 0 refills | Status: AC | PRN

## 2022-06-22 MED ORDER — MORPHINE SULFATE (PF) 4 MG/ML IV SOLN
4.0000 mg | Freq: Once | INTRAVENOUS | Status: AC
  Administered 2022-06-22: 4 mg via INTRAVENOUS
  Filled 2022-06-22: qty 1

## 2022-06-22 MED ORDER — ONDANSETRON HCL 4 MG/2ML IJ SOLN
4.0000 mg | Freq: Once | INTRAMUSCULAR | Status: AC
  Administered 2022-06-22: 4 mg via INTRAVENOUS
  Filled 2022-06-22: qty 2

## 2022-06-22 NOTE — ED Notes (Signed)
Patient transported to CT 

## 2022-06-22 NOTE — Discharge Instructions (Signed)
Follow these instructions at home: Lose weight, if told by your health care provider. Do not try to push the hernia back in. Watch your hernia for any changes in color or size. Tell your health care provider if any changes occur. You may need to avoid activities that increase pressure on your hernia. Do not lift anything that is heavier than 10 lb (4.5 kg), or the limit that you are told, until your health care provider says that it is safe. Take over-the-counter and prescription medicines only as told by your health care provider. Keep all follow-up visits. This is important. Contact a health care provider if: Your hernia gets larger. Your hernia becomes painful. Get help right away if: You develop sudden, severe pain near the area of your hernia. You have pain as well as nausea or vomiting. You have pain and the skin over your hernia changes color. You develop a fever or chills.

## 2022-06-22 NOTE — ED Triage Notes (Signed)
Pt arrived POV from home, ambulatory, NAD. Pt caox4 c/o intermittent periumbilical abd pain that started yesterday. Pt states pain was sharp yesterday, but feels dull today. Pt denies N/V/D, fever, or urinary s/s. LBM this morning and normal.

## 2022-06-22 NOTE — ED Provider Notes (Signed)
[facility] Provider Note   CSN: [id number] Arrival date & time: 06/22/22  1009     History  Chief Complaint  Patient presents with   Abdominal Pain    Shaun Steele is a 62 y.o. male.  The history is provided by the patient. No language interpreter was used.  Abdominal Pain Pain location:  RLQ Pain quality: aching and sharp   Pain severity:  Moderate Onset quality:  Gradual Duration:  1 day Timing:  Constant Progression:  Waxing and waning Chronicity:  New Context: not previous surgeries, not recent illness, not recent travel, not retching, not sick contacts, not suspicious food intake and not trauma   Relieved by:  Lying down Worsened by:  Movement and position changes Ineffective treatments:  Acetaminophen Associated symptoms: anorexia   Associated symptoms: no constipation, no diarrhea, no dysuria, no fever, no hematuria, no melena, no nausea, no shortness of breath, no sore throat and no vomiting   Risk factors: no alcohol abuse and has not had multiple surgeries        Home Medications Prior to Admission medications   Medication Sig Start Date End Date Taking? Authorizing Provider  amoxicillin (AMOXIL) 500 MG tablet Take 2 tablets (1,000 mg total) by mouth in the morning, at noon, and at bedtime. 02/01/21   Domenic Moras, PA-C  azithromycin (ZITHROMAX Z-PAK) 250 MG tablet 2 po day one, then 1 daily x 4 days 02/01/21   Domenic Moras, PA-C  cyclobenzaprine (FLEXERIL) 10 MG tablet Take 1 tablet (10 mg total) by mouth 2 (two) times daily as needed for muscle spasms. 03/08/19   Khatri, Hina, PA-C  lidocaine (LIDODERM) 5 % Place 1 patch onto the skin daily. Remove & Discard patch within 12 hours or as directed by MD 03/08/19   Delia Heady, PA-C  methocarbamol (ROBAXIN) 500 MG tablet Take 1 tablet (500 mg total) by mouth at bedtime as needed for muscle spasms. 12/12/17   Caccavale, Sophia, PA-C  phenol (CHLORASEPTIC) 1.4 % LIQD Use  as directed 1 spray in the mouth or throat as needed for throat irritation / pain. 04/04/21   Couture, Cortni S, PA-C  predniSONE (DELTASONE) 10 MG tablet 6 tablets 1st day, 5 tablets 2nd day, 4 tablets 3rd day, 3 tablets 4th day, 2 tablets 5th day and 1 tablet 6th day 03/05/19   Fransico Meadow, PA-C  pregabalin (LYRICA) 75 MG capsule Take 1 capsule (75 mg total) by mouth 2 (two) times daily. 02/27/17   Maczis, Barth Kirks, PA-C      Allergies    Patient has no known allergies.    Review of Systems   Review of Systems  Constitutional:  Negative for fever.  HENT:  Negative for sore throat.   Respiratory:  Negative for shortness of breath.   Gastrointestinal:  Positive for abdominal pain and anorexia. Negative for constipation, diarrhea, melena, nausea and vomiting.  Genitourinary:  Negative for dysuria and hematuria.    Physical Exam Updated Vital Signs BP (!) 145/87 (BP Location: Right Arm)   Pulse 77   Temp (!) 97.5 F (36.4 C) (Oral)   Resp 20   Ht 6\' 3"  (1.905 m)   Wt 105.2 kg   SpO2 100%   BMI 29.00 kg/m  Physical Exam Vitals and nursing note reviewed.  Constitutional:      General: He is not in acute distress.    Appearance: He is well-developed. He is not diaphoretic.  HENT:  Head: Normocephalic and atraumatic.  Eyes:     General: No scleral icterus.    Conjunctiva/sclera: Conjunctivae normal.  Cardiovascular:     Rate and Rhythm: Normal rate and regular rhythm.     Heart sounds: Normal heart sounds.  Pulmonary:     Effort: Pulmonary effort is normal. No respiratory distress.     Breath sounds: Normal breath sounds.  Abdominal:     Palpations: Abdomen is soft.     Tenderness: There is abdominal tenderness in the right lower quadrant. There is guarding.  Musculoskeletal:     Cervical back: Normal range of motion and neck supple.  Skin:    General: Skin is warm and dry.  Neurological:     Mental Status: He is alert.  Psychiatric:        Behavior: Behavior  normal.     ED Results / Procedures / Treatments   Labs (all labs ordered are listed, but only abnormal results are displayed) Labs Reviewed  CBC WITH DIFFERENTIAL/PLATELET - Abnormal; Notable for the following components:      Result Value   WBC 3.8 (*)    HCT 38.9 (*)    Neutro Abs 1.6 (*)    All other components within normal limits  COMPREHENSIVE METABOLIC PANEL  LIPASE, BLOOD  URINALYSIS, ROUTINE W REFLEX MICROSCOPIC    EKG None  Radiology No results found.  Procedures Procedures    Medications Ordered in ED Medications  morphine (PF) 4 MG/ML injection 4 mg (4 mg Intravenous Given 06/22/22 1103)  ondansetron (ZOFRAN) injection 4 mg (4 mg Intravenous Given 06/22/22 1103)    ED Course/ Medical Decision Making/ A&P Clinical Course as of 06/22/22 1346  Tue Jun 22, 2022  1340 Lipase, blood(!) [AH]  1340 Comprehensive metabolic panel(!) [AH]  123456 Urinalysis, Routine w reflex microscopic -Urine, Clean Catch(!) [AH]  1341 Lactic acid, plasma [AH]  1341 CBC with Differential(!) [AH]  1341 WBC(!): 3.8 [AH]  1341 CT ABDOMEN PELVIS WO CONTRAST Labs reviewed, no elevated white blood cell count, CT abdomen pelvis shows fat-containing umbilical hernia.  Patient point tender in this location. [AH]  1341 I discussed the case with PA Margie Billet.  Patient does not have any signs or symptoms of infection, no elevated white blood cell count, no redness.  Given these findings we will have the patient follow very closely in clinic.  She is reaching out to the schedulers at the facility to get him an appointment.  I discussed all findings with the patient at bedside.  He is comfortable with this plan. [AH]    Clinical Course User Index [AH] Margarita Mail, PA-C                             Medical Decision Making 62 year old male with onset of abdominal pain beginning yesterday sent in for evaluation of right lower quadrant abdominal pain.  On evaluation patient has an umbilical  hernia which is tender as well as tenderness in the lower abdomen and right lower quadrant. The differential diagnosis for generalized abdominal pain includes, but is not limited to AAA, gastroenteritis, appendicitis, Bowel obstruction, Bowel perforation. Gastroparesis, DKA, Hernia, Inflammatory bowel disease, mesenteric ischemia, pancreatitis, peritonitis SBP, volvulus.  Patient has no significant comorbidities affecting care.  After review of all data points patient appears to have a fat-containing hernia with stranding on CT imaging.  Pain is well-controlled after fentanyl and antiemetics.  I discussed the case with PA Meuth.  He has no evidence of infection or obstruction.  He will follow closely in clinic.  PDMP reviewed and pain medications ordered. Patient is advised to return immediately for any change in color of the hernia, severe pain out of proportion and not controlled pain medications, intractable vomiting, fever or chills.  Amount and/or Complexity of Data Reviewed Labs: ordered. Decision-making details documented in ED Course. Radiology: ordered and independent interpretation performed. Decision-making details documented in ED Course.    Details: I personally visualized and interpreted the images using our PACS system. Acute findings include:  Umbilical hernia without evidence of obstruction, incarceration or acute appendicitis.  Risk Prescription drug management.           Final Clinical Impression(s) / ED Diagnoses Final diagnoses:  Umbilical hernia without obstruction and without gangrene    Rx / DC Orders ED Discharge Orders     None         Margarita Mail, PA-C 06/22/22 1349    Regan Lemming, MD 06/22/22 1621

## 2022-10-06 ENCOUNTER — Emergency Department (HOSPITAL_BASED_OUTPATIENT_CLINIC_OR_DEPARTMENT_OTHER)
Admission: EM | Admit: 2022-10-06 | Discharge: 2022-10-06 | Disposition: A | Discharge status: Home / Self Care | Payer: BC Managed Care – PPO | Attending: Emergency Medicine | Admitting: Emergency Medicine

## 2022-10-06 ENCOUNTER — Encounter (HOSPITAL_BASED_OUTPATIENT_CLINIC_OR_DEPARTMENT_OTHER): Payer: Self-pay | Admitting: Emergency Medicine

## 2022-10-06 ENCOUNTER — Other Ambulatory Visit: Payer: Self-pay

## 2022-10-06 DIAGNOSIS — K429 Umbilical hernia without obstruction or gangrene: Secondary | ICD-10-CM | POA: Insufficient documentation

## 2022-10-06 DIAGNOSIS — R1033 Periumbilical pain: Secondary | ICD-10-CM | POA: Diagnosis present

## 2022-10-06 LAB — CBC
HCT: 39.2 % (ref 39.0–52.0)
Hemoglobin: 13.4 g/dL (ref 13.0–17.0)
MCH: 29.5 pg (ref 26.0–34.0)
MCHC: 34.2 g/dL (ref 30.0–36.0)
MCV: 86.2 fL (ref 80.0–100.0)
Platelets: 248 10*3/uL (ref 150–400)
RBC: 4.55 MIL/uL (ref 4.22–5.81)
RDW: 12.3 % (ref 11.5–15.5)
WBC: 4.3 10*3/uL (ref 4.0–10.5)
nRBC: 0 % (ref 0.0–0.2)

## 2022-10-06 LAB — URINALYSIS, ROUTINE W REFLEX MICROSCOPIC
Bilirubin Urine: NEGATIVE
Glucose, UA: NEGATIVE mg/dL
Hgb urine dipstick: NEGATIVE
Ketones, ur: NEGATIVE mg/dL
Leukocytes,Ua: NEGATIVE
Nitrite: NEGATIVE
Protein, ur: NEGATIVE mg/dL
Specific Gravity, Urine: 1.01 (ref 1.005–1.030)
pH: 8 (ref 5.0–8.0)

## 2022-10-06 LAB — COMPREHENSIVE METABOLIC PANEL
ALT: 17 U/L (ref 0–44)
AST: 18 U/L (ref 15–41)
Albumin: 4.1 g/dL (ref 3.5–5.0)
Alkaline Phosphatase: 51 U/L (ref 38–126)
Anion gap: 6 (ref 5–15)
BUN: 11 mg/dL (ref 8–23)
CO2: 30 mmol/L (ref 22–32)
Calcium: 10.4 mg/dL — ABNORMAL HIGH (ref 8.9–10.3)
Chloride: 102 mmol/L (ref 98–111)
Creatinine, Ser: 0.84 mg/dL (ref 0.61–1.24)
GFR, Estimated: >60 mL/min (ref >60)
Glucose, Bld: 103 mg/dL — ABNORMAL HIGH (ref 70–99)
Potassium: 4.1 mmol/L (ref 3.5–5.1)
Sodium: 138 mmol/L (ref 135–145)
Total Bilirubin: 1 mg/dL (ref 0.3–1.2)
Total Protein: 7.2 g/dL (ref 6.5–8.1)

## 2022-10-06 LAB — LIPASE, BLOOD: Lipase: 54 U/L — ABNORMAL HIGH (ref 11–51)

## 2022-10-06 MED ORDER — OXYCODONE-ACETAMINOPHEN 5-325 MG PO TABS
1.0000 | ORAL_TABLET | Freq: Once | ORAL | Status: AC
  Administered 2022-10-06: 1 via ORAL
  Filled 2022-10-06: qty 1

## 2022-10-06 NOTE — Discharge Instructions (Signed)
Your pain is likely due to herniation of your umbilical hernia which was reduced by me.  Avoid heavy lifting.  Call and follow-up closely with a general surgeon for outpatient managements of your condition which may include surgical intervention.  Return if you have any concern.

## 2022-10-06 NOTE — ED Provider Notes (Signed)
[place] EMERGENCY DEPARTMENT AT [facility] Provider Note   CSN: [id number] Arrival date & time: 10/06/22  1029     History  Chief Complaint  Patient presents with   Abdominal Pain    Shaun Steele is a 62 y.o. male.  The history is provided by the patient and medical records. No language interpreter was used.  Abdominal Pain    62 year old male significant history umbilical hernia and left inguinal hernia status post hernia repair presenting today with complaint of abdominal pain.  Patient reports that he was at work today and he developed acute onset of pain about his abdomen.  Pain is most significant to his mid abdomen that comes in waves.  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  On the way here he endorsed worsening pain.  He does not endorse any fever or chills no nausea vomiting or diarrhea or constipation.  Last bowel movement was earlier today.  No chest pain or shortness of breath no trouble urinating.  Denies any specific treatment tried.  Denies any heavy lifting.  Home Medications Prior to Admission medications   Medication Sig Start Date End Date Taking? Authorizing Provider  amoxicillin (AMOXIL) 500 MG tablet Take 2 tablets (1,000 mg total) by mouth in the morning, at noon, and at bedtime. 02/01/21   Fayrene Helper, PA-C  azithromycin (ZITHROMAX Z-PAK) 250 MG tablet 2 po day one, then 1 daily x 4 days 02/01/21   Fayrene Helper, PA-C  cyclobenzaprine (FLEXERIL) 10 MG tablet Take 1 tablet (10 mg total) by mouth 2 (two) times daily as needed for muscle spasms. 03/08/19   Khatri, Hina, PA-C  lidocaine (LIDODERM) 5 % Place 1 patch onto the skin daily. Remove & Discard patch within 12 hours or as directed by MD 03/08/19   Dietrich Pates, PA-C  methocarbamol (ROBAXIN) 500 MG tablet Take 1 tablet (500 mg total) by mouth at bedtime as needed for muscle spasms. 12/12/17   Caccavale,  Sophia, PA-C  ondansetron (ZOFRAN-ODT) 4 MG disintegrating tablet Take 1 tablet (4 mg total) by mouth every 8 (eight) hours as needed for nausea or vomiting. 06/22/22   Harris, Cammy Copa, PA-C  oxyCODONE (ROXICODONE) 5 MG immediate release tablet Take 0.5-1 tablets (2.5-5 mg total) by mouth every 4 (four) hours as needed for severe pain. 06/22/22   Harris, Abigail, PA-C  phenol (CHLORASEPTIC) 1.4 % LIQD Use as directed 1 spray in the mouth or throat as needed for throat irritation / pain. 04/04/21   Couture, Cortni S, PA-C  predniSONE (DELTASONE) 10 MG tablet 6 tablets 1st day, 5 tablets 2nd day, 4 tablets 3rd day, 3 tablets 4th day, 2 tablets 5th day and 1 tablet 6th day 03/05/19   Elson Areas, PA-C  pregabalin (LYRICA) 75 MG capsule Take 1 capsule (75 mg total) by mouth 2 (two) times daily. 02/27/17   Maczis, Elmer Sow, PA-C      Allergies    Patient has no known allergies.    Review of Systems   Review of Systems  Gastrointestinal:  Positive for abdominal pain.  All other systems reviewed and are negative.   Physical Exam Updated Vital Signs BP 137/87   Pulse 65   Temp 98 F (36.7 C) (Oral)   Resp 18   Ht 6\' 2"  (1.88 m)   Wt 106.1 kg   SpO2 100%   BMI 30.04 kg/m  Physical Exam Vitals  and nursing note reviewed.  Constitutional:      General: He is not in acute distress.    Appearance: He is well-developed.  HENT:     Head: Atraumatic.  Eyes:     Conjunctiva/sclera: Conjunctivae normal.  Cardiovascular:     Rate and Rhythm: Normal rate and regular rhythm.  Pulmonary:     Effort: Pulmonary effort is normal.     Breath sounds: Normal breath sounds.  Abdominal:     Palpations: Abdomen is soft.     Tenderness: There is abdominal tenderness in the periumbilical area.     Hernia: A hernia is present. Hernia is present in the umbilical area.  Musculoskeletal:     Cervical back: Neck supple.  Skin:    Findings: No rash.  Neurological:     Mental Status: He is alert.      ED Results / Procedures / Treatments   Labs (all labs ordered are listed, but only abnormal results are displayed) Labs Reviewed  LIPASE, BLOOD - Abnormal; Notable for the following components:      Result Value   Lipase 54 (*)    All other components within normal limits  COMPREHENSIVE METABOLIC PANEL - Abnormal; Notable for the following components:   Glucose, Bld 103 (*)    Calcium 10.4 (*)    All other components within normal limits  CBC  URINALYSIS, ROUTINE W REFLEX MICROSCOPIC    EKG None  Radiology No results found.  Procedures Hernia reduction  Date/Time: 10/06/2022 11:41 AM  Performed by: Fayrene Helper, PA-C Authorized by: Fayrene Helper, PA-C  Consent: Verbal consent obtained. Risks and benefits: risks, benefits and alternatives were discussed Consent given by: patient Patient understanding: patient states understanding of the procedure being performed Patient consent: the patient's understanding of the procedure matches consent given Patient identity confirmed: verbally with patient and arm band Local anesthesia used: no  Anesthesia: Local anesthesia used: no  Sedation: Patient sedated: no  Patient tolerance: patient tolerated the procedure well with no immediate complications Comments: Steady pressure was applied to umbilical hernia for approximately 30 seconds with reduction of that hernia       Medications Ordered in ED Medications  oxyCODONE-acetaminophen (PERCOCET/ROXICET) 5-325 MG per tablet 1 tablet (1 tablet Oral Given 10/06/22 1146)    ED Course/ Medical Decision Making/ A&P                             Medical Decision Making Amount and/or Complexity of Data Reviewed Labs: ordered.  Risk Prescription drug management.   BP 137/87   Pulse 65   Temp 98 F (36.7 C) (Oral)   Resp 18   Ht 6\' 2"  (1.88 m)   Wt 106.1 kg   SpO2 100%   BMI 30.04 kg/m   69:82 AM  62 year old male significant history umbilical hernia and left  inguinal hernia status post hernia repair presenting today with complaint of abdominal pain.  Patient reports that he was at work today and he developed acute onset of pain about his abdomen.  Pain is most significant to his mid abdomen that comes in waves.  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  On the way here he endorsed worsening pain.  He does not endorse any fever or chills no nausea vomiting or diarrhea or constipation.  Last bowel movement was earlier today.  No chest pain or shortness of breath no trouble urinating.  Denies any specific treatment tried.  Denies any heavy lifting.  On exam, patient is laying bed appears slightly uncomfortable but nontoxic in appearance.  Heart with normal rate and rhythm, lungs are clear to auscultation bilaterally abdomen is soft however patient does have an umbilical hernia that was easily reducible by me by applying steady pressure to the hernia.  Once I am able to reduce the hernia, patient reported his pain did improved.  I suspect his primary complaint is an nonincarcerated or strangulated umbilical hernia.  Patient however requesting for medication to help with his pain.  Will provide supportive care and will continue to monitor.  -Labs ordered, independently viewed and interpreted by me.  Labs remarkable for minimally elevated lipase of 54.  -The patient was maintained on a cardiac monitor.  I personally viewed and interpreted the cardiac monitored which showed an underlying rhythm of: NSR -Imaging including abd/pelvis CT considered but not performed as sxs improves after hernia reduction -This patient presents to the ED for concern of abd pain, this involves an extensive number of treatment options, and is a complaint that carries with it a high risk of complications and morbidity.  The differential diagnosis includes umbilical hernia, strangulation of hernia,  incarceration of hernia, colitis, diverticulitis, pancreatitis, gastritis, appendicitis, cholecystitis -Co morbidities that complicate the patient evaluation includes hx of hernia -Treatment includes hernia reduction and percocet -Reevaluation of the patient after these medicines showed that the patient resolved -PCP office notes or outside notes reviewed -Escalation to admission/observation considered: patients feels much better, is comfortable with discharge, and will follow up with [facility] -Prescription medication considered, patient comfortable with OTC meds -Social Determinant of Health considered  12:55 PM Successful reduction of umbilical hernia by me.  After 2 hours of monitoring, patient states symptoms has resolved.  Labs overall reassuring.  Suspect his initial discomfort is due to his umbilical hernia.  Encourage patient to avoid heavy lifting, and to follow-up closely with general surgery for outpatient management of his hernia.  Patient voiced understanding and agrees with plan.  Return precaution given.         Final Clinical Impression(s) / ED Diagnoses Final diagnoses:  Umbilical hernia without obstruction and without gangrene    Rx / DC Orders ED Discharge Orders     None         Fayrene Helper, PA-C 10/06/22 1258    Melene Plan, DO 10/06/22 1347

## 2022-10-06 NOTE — ED Triage Notes (Signed)
Patient arrives by POV c/o sharp abdominal pain onset of waking up this morning. States pain is intermittent. Denies any N/V/D or any urinary symptoms.

## 2022-11-26 ENCOUNTER — Ambulatory Visit: Payer: BC Managed Care – PPO | Admitting: Nurse Practitioner

## 2023-10-13 ENCOUNTER — Ambulatory Visit (INDEPENDENT_AMBULATORY_CARE_PROVIDER_SITE_OTHER): Payer: Self-pay | Admitting: Orthopaedic Surgery

## 2023-10-13 ENCOUNTER — Other Ambulatory Visit (INDEPENDENT_AMBULATORY_CARE_PROVIDER_SITE_OTHER): Payer: Self-pay

## 2023-10-13 DIAGNOSIS — M545 Low back pain, unspecified: Secondary | ICD-10-CM | POA: Diagnosis not present

## 2023-10-13 MED ORDER — DICLOFENAC SODIUM 75 MG PO TBEC: 75.0000 mg | DELAYED_RELEASE_TABLET | Freq: Two times a day (BID) | ORAL | 1 refills | Status: DC | PRN

## 2023-10-13 MED ORDER — TIZANIDINE HCL 4 MG PO TABS: 4.0000 mg | ORAL_TABLET | Freq: Three times a day (TID) | ORAL | 0 refills | Status: DC | PRN

## 2023-10-13 NOTE — Addendum Note (Signed)
 Addended by: Miia Blanks on: 10/13/2023 03:12 PM   Modules accepted: Orders

## 2023-10-13 NOTE — Progress Notes (Signed)
+   The patient is a 63 year old gentleman that we are seeing for the first time.  This is for low back pain.  He reports a 2-month history of worsening low back pain.  He has tried anti-inflammatories such as ibuprofen  as well as rest.  He does report a history of likely an L5-S1 discectomy that was done in 2020.  I did see a MRI of his lumbar spine from 2019 and another from 2020.  His surgery was done in town here but he does not know who did his surgery.  I cannot find a record of this within epic but he may have had surgery at a private surgical center.  He denies any radicular symptoms going all the way down to his feet and most of his pain is in the lower lumbar spine that radiates out to his sides but not down his legs.  He does have a small component of sciatica on the right and left side.  He denies any change in bowel or bladder function.  He is not a diabetic.  On exam he does have pain with flexion extension of the lumbar spine and pain in the paraspinal muscles.  He has 5 out of 5 strength of his bilateral lower extremities.  There are no sensory deficits today.  2 views of the lumbar spine show normal lumbar lordosis but there is degenerative disc disease between L5 and S1 as well as arthritic changes however up and in the posterior elements.  He has been through physical therapy he states and at this point we are recommending a MRI of the lumbar spine so we can determine whether or not he would benefit from other types of injections.  I will send in diclofenac  and Zanaflex  in the interim we will see him back once we have the MRI of his lumbar spine.  I have encouraged him to find out who did his previous lumbar spine surgery.

## 2023-10-17 ENCOUNTER — Encounter: Payer: Self-pay | Admitting: Orthopaedic Surgery

## 2023-10-20 ENCOUNTER — Encounter (HOSPITAL_BASED_OUTPATIENT_CLINIC_OR_DEPARTMENT_OTHER): Payer: Self-pay

## 2023-10-20 IMAGING — DX DG CHEST 2V
2 series · 2 of 2 positions shown · non-contrast
Comparison: 02/21/2021

CLINICAL DATA: Cough

EXAM:
CHEST - 2 VIEW

[chest pa]
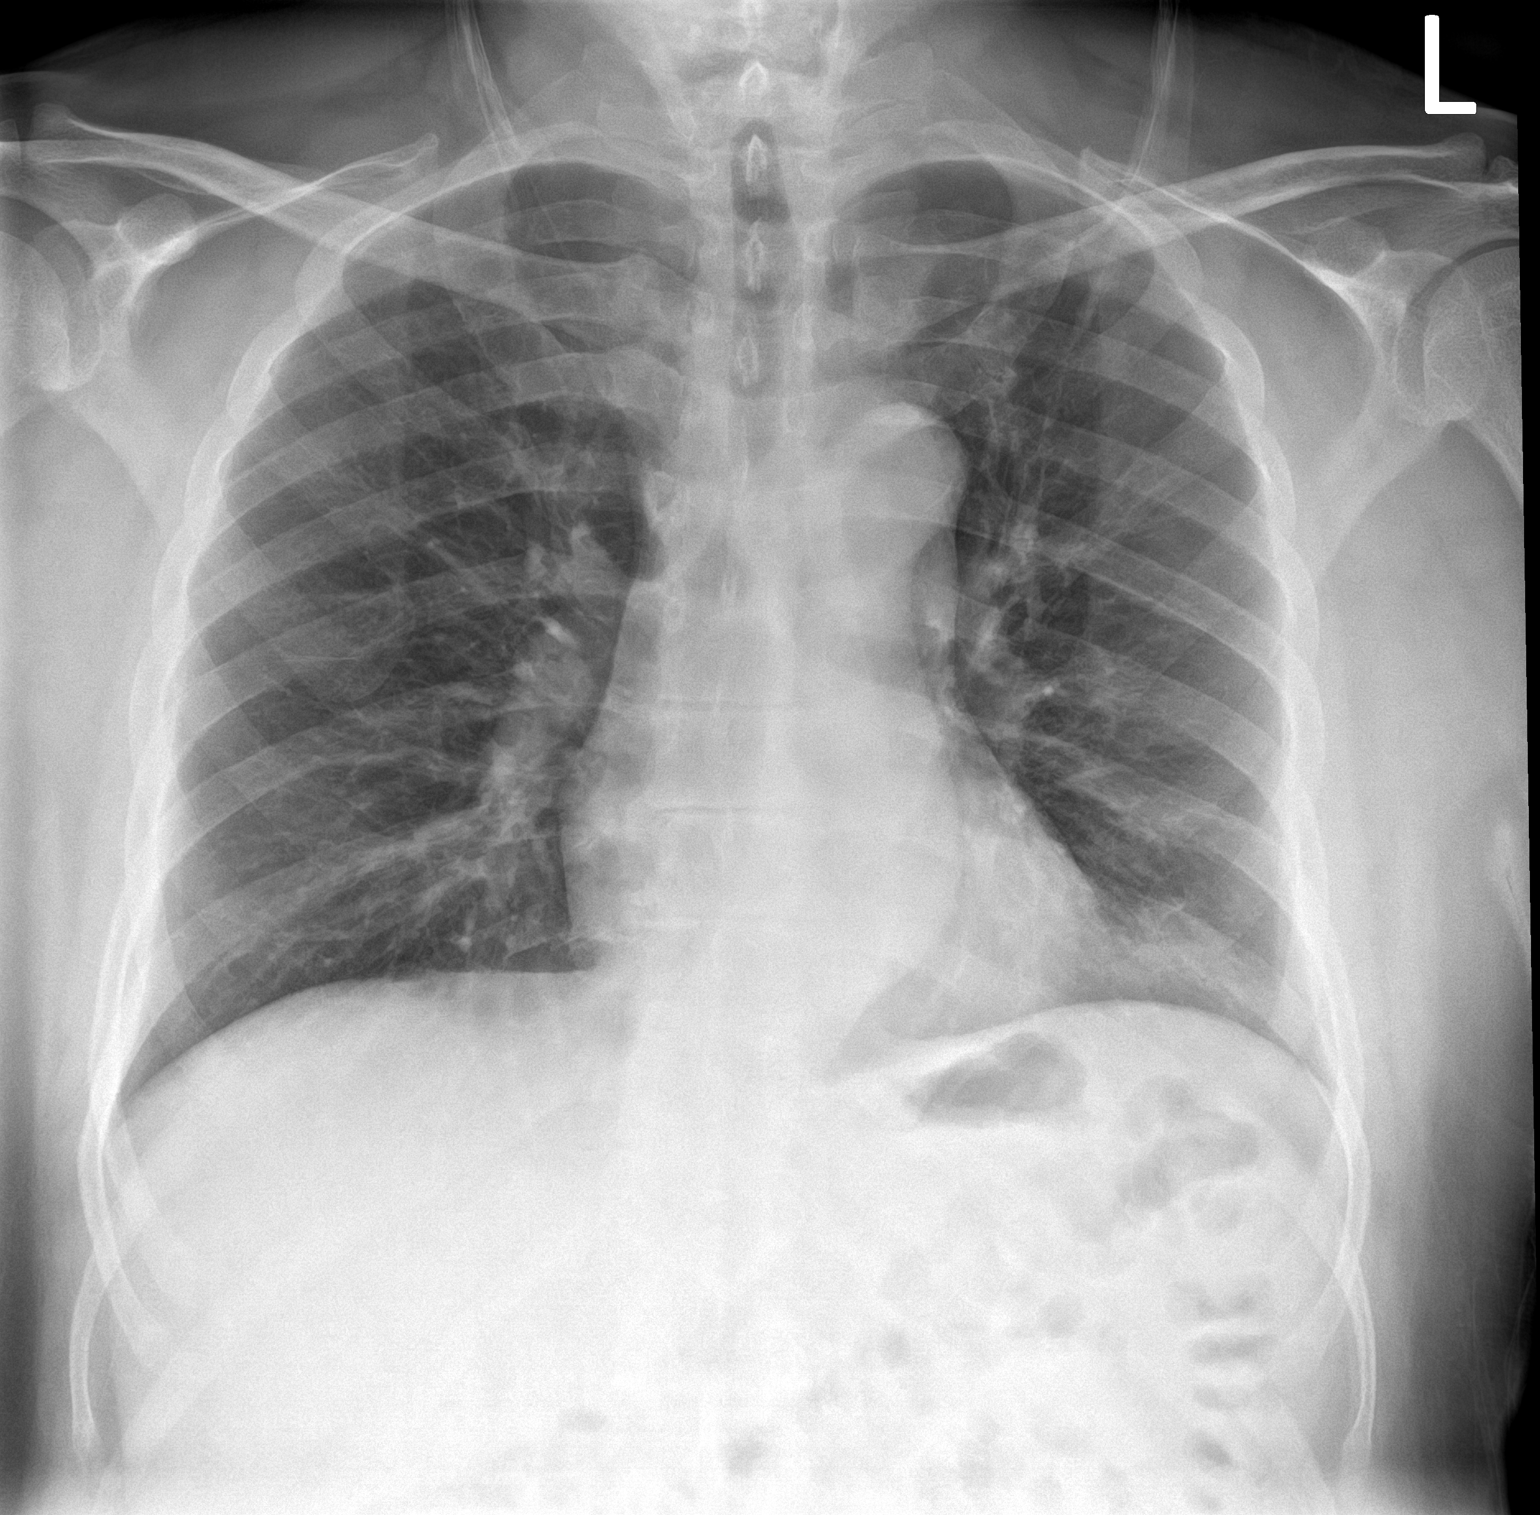

[chest lat]
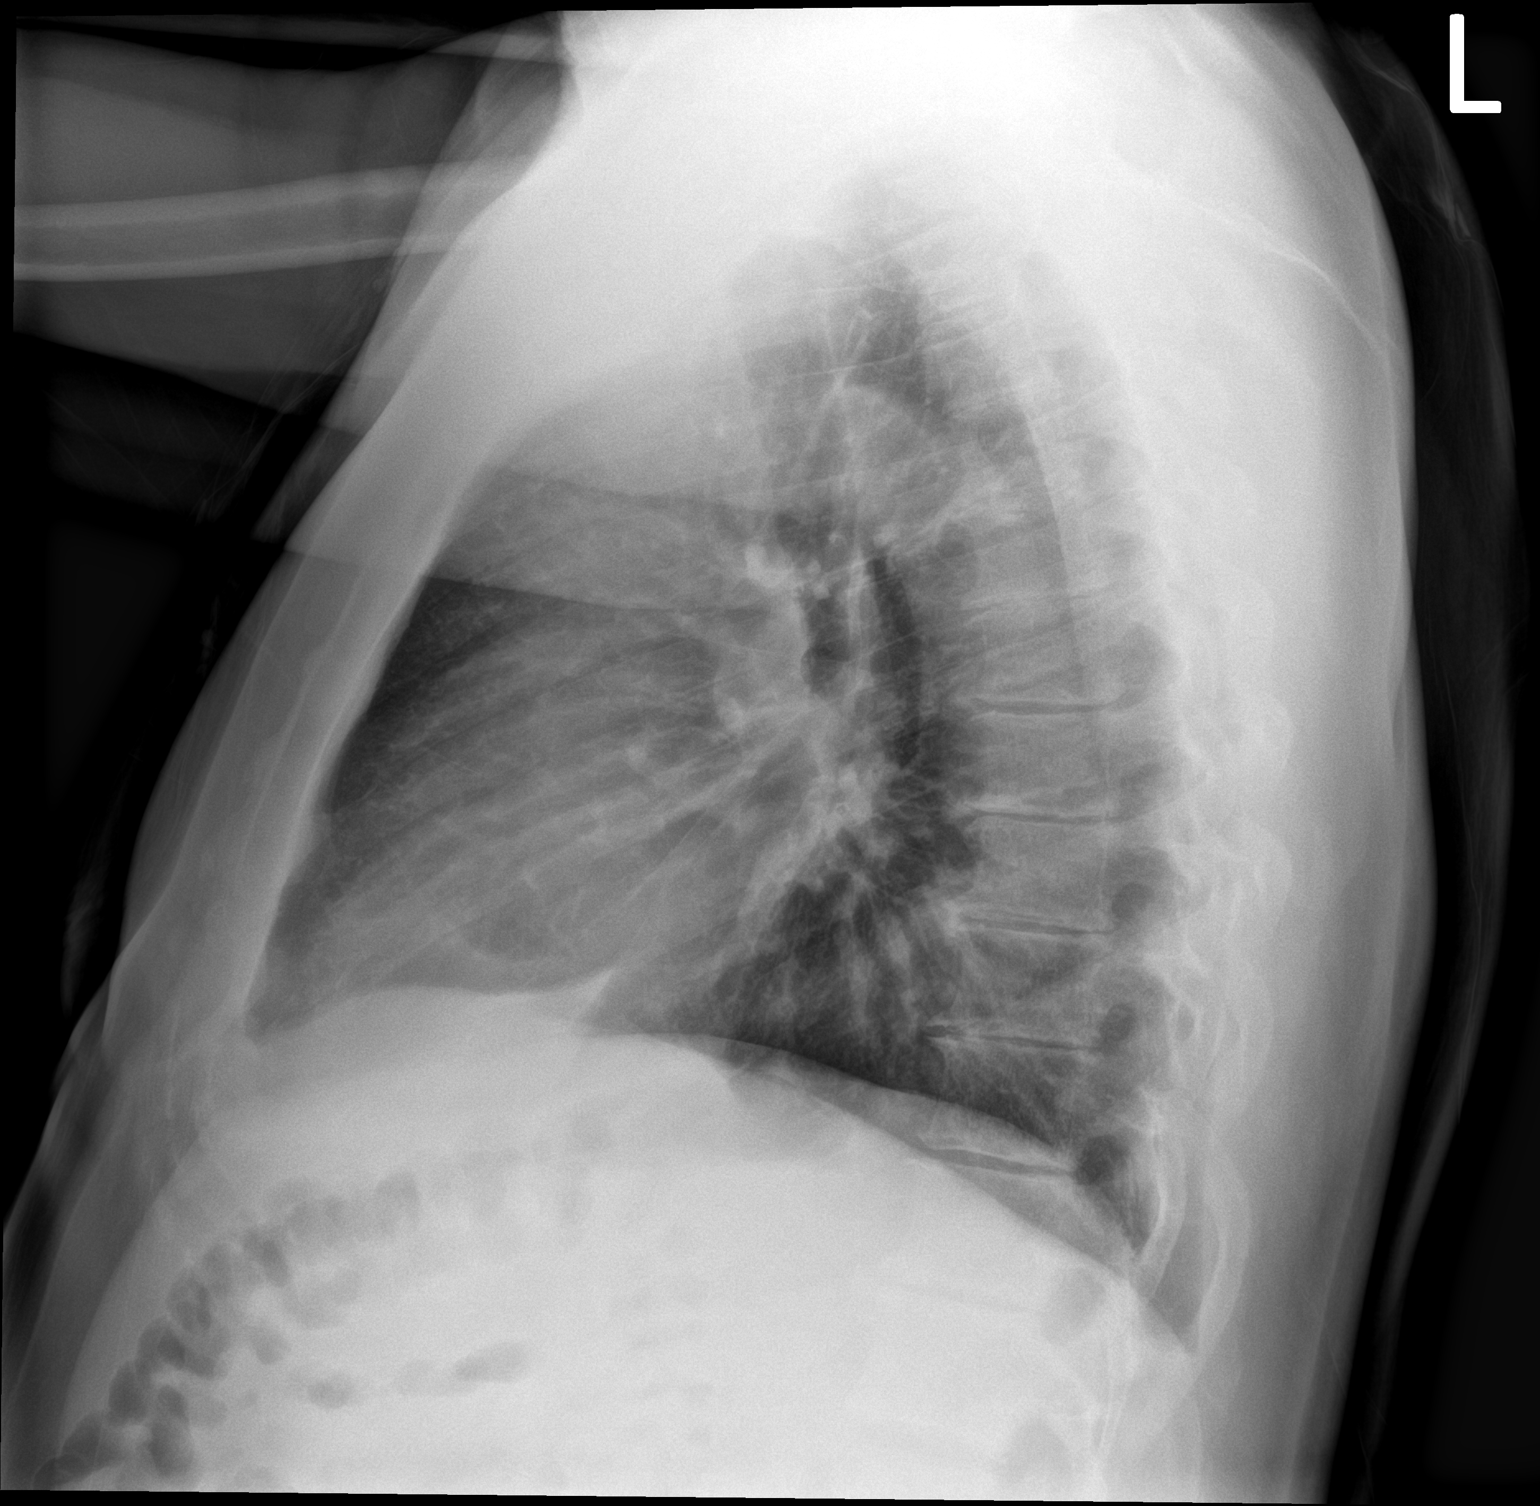

[2 of 2 positions shown; findings below may reference images not displayed]

FINDINGS: The heart size and mediastinal contours are within normal limits.
Both lungs are clear. The visualized skeletal structures are
unremarkable.
IMPRESSION: No active cardiopulmonary disease.

## 2023-10-22 ENCOUNTER — Ambulatory Visit
Admission: RE | Admit: 2023-10-22 | Discharge: 2023-10-22 | Disposition: A | Discharge status: Home / Self Care | Source: Ambulatory Visit | Attending: Orthopaedic Surgery | Admitting: Orthopaedic Surgery

## 2023-10-22 DIAGNOSIS — M545 Low back pain, unspecified: Secondary | ICD-10-CM

## 2023-11-16 ENCOUNTER — Encounter: Payer: Self-pay | Admitting: Orthopaedic Surgery

## 2023-11-16 ENCOUNTER — Ambulatory Visit (INDEPENDENT_AMBULATORY_CARE_PROVIDER_SITE_OTHER): Admitting: Orthopaedic Surgery

## 2023-11-16 DIAGNOSIS — G8929 Other chronic pain: Secondary | ICD-10-CM

## 2023-11-16 DIAGNOSIS — M545 Low back pain, unspecified: Secondary | ICD-10-CM

## 2023-11-16 MED ORDER — TIZANIDINE HCL 4 MG PO TABS: 4.0000 mg | ORAL_TABLET | Freq: Three times a day (TID) | ORAL | 0 refills | Status: AC | PRN

## 2023-11-16 MED ORDER — DICLOFENAC SODIUM 75 MG PO TBEC: 75.0000 mg | DELAYED_RELEASE_TABLET | Freq: Two times a day (BID) | ORAL | 1 refills | Status: AC | PRN

## 2023-11-16 NOTE — Progress Notes (Signed)
 Athletic Training Daily Note  Name: Shaun Steele Referring Diagnosis: New Dx: anterior tibialis tendonitis, L knee pain; previous dx: acute bilat lumbar pain; bilat knee pain; L elbow pain; R peroneal tendonitis  Date of Surgery/Incident: Chronic Referring Clinician:  Freddrick Dolores, NP Referring Service/Team: [facility] onsite primary care clinic  Subjective: Sees ortho today @ 3:30 p.m. for his MRI results. Shaun Steele notes he is still walking to [employer] daily and that this helps quite a bit with LBP symptom control. Reports 4/10 LBP consistently before tx and 1-2/10 after tx. 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    Medications: Ibuprofen  800 mg every day daily with meals.    Concurrent services: [facility] employee wellness clinic; awaiting rheum referral. PMHx: Hx of rheumatoid arthritis; he reports still having flares of it; L4-5 lumbar fusion 8 years ago per subjective report.    Imaging: MRI on 10-22-2023 revealed the following: 1. The patient has developed a new/enlarging lesion within the L1  vertebral body measuring 14 mm in diameter. Because this was not  seen previously, this is viewed with some concern. Does the patient  have a history of malignancy? Consider postcontrast lumbar MRI  evaluation for better characterization.  2. L3-4: Left extraforaminal disc herniation with some potential to  compress or irritate the left L3 nerve. This is similar in  appearance to the study of 2020.  3. L4-5: Disc bulge. Facet degeneration and hypertrophy. Mild  stenosis of both lateral recesses but no definite neural  compression. A central to left-sided disc herniation seen in 2020  has involuted.  4. L5-S1: Chronic disc degeneration with loss of disc height.  Endplate edematous changes anteriorly, likely to relate to lower  back pain. Previous right hemilaminectomy and discectomy. Mild  stenosis of the  subarticular lateral recess on the left but without  definite neural compression.   Observation:     RFISit AROM x5 full ROM without problems; all other AROM directions WNLs for the lumbar spine. Dons shoes sitting in figure 4 position without problems now; struggled with this before starting rehab.    Therapeutic Interventions:   Prone MHP & Premod e-stim 80-150Hz  x12' pre-tx to decrease stiffness Prone on elbows x3' Prone press ups w/ exhale & sag 2x10 Prone quad stretches manual 2x30s bilat Supine hamstring stretching w/ strap and therapist assist 2x30s bilat Hip add red medball squeeze w/ 1/4 bridge 2x10 5s holds  L sidelying flex & rotate 2x10 Sit to stand from chair 10# Kettlebell 2x10 Wrist extensions eccentric emphasis in sitting 3x10 3# dumbbells  Standing shoulder retractions grey 2x15   CPT codes and time:   [id number]' [id number]' w/ [id number]'  Assessment: Tolerates resistance with therapeutic ex well. He is improving greatly with gradually added core stability activities to finalize functional strength.    LBP: Good response with McKenzie flex & lateral principles in spite of intermittent LBP.  STGs: (1-3 weeks) MET 09-28-2023 (1) AT visit compliance, (2) HEP compliance, (3) Pain no > 2-3/10 in L knee.   LTGs: (4-8 weeks) Goal # 2 MET 08-31-2023 (1) Restoration of pain-free function in L knee with ADLs. (2) Good return demo and verbalization of McKenzie principles to mitigate future insult.  Plan: Await ortho evaluation of MRI findings today @ 3:30 p.m.   Directional preference flexion w/ lateral component: L sidelying flex & rotate. AT rehab twice weekly for 3 more weeks to  decrease inflammation & promote functional strength & ROM.    Pt was instructed to contact our clinic with any questions.   Shaun Segers, MS, LAT, ATC

## 2023-11-16 NOTE — Progress Notes (Signed)
 The patient comes in today to go over MRI of his lumbar spine.  He did state that he remembers now that he did have lumbar spine surgery by a surgeon at [facility]. and this is definitely [place] [facility].  He just does not know who did his surgery.  He does a previous right sided hemilaminectomy and discectomy at L5-S1.  He was having just low back pain and no radicular components.  The MRI does show facet hypertrophy and degeneration L4-L5 as well as chronic degeneration L5-S1 from his previous surgery.  It did not show any neural compression at those levels.  However the MRI did show a new enlarging lesion within the L1 vertebral body that they measured about 14 mm.  This was not seen on her previous MRI so the radiologist says that is concerning and that a postcontrast lumbar MRI to evaluate this lesion better is indicated.  I can see from the notes that his nurse practitioner/PCP has noted this and labs have been ordered and done last week.  The patient does not show any signs and symptoms of any other type of malignancy but certainly this still could be the case.  We will order a MRI with contrast of the lumbar spine to evaluate the lesion in L1 vertebral body and then go from there in terms of what her next recommendations will be in terms of this year referral to neurosurgery versus another workup which would include a CT of his chest, abdomen and pelvis.  We will order the lumbar MRI and go from there.  He understands this as well.

## 2023-11-17 ENCOUNTER — Other Ambulatory Visit: Payer: Self-pay

## 2023-11-17 DIAGNOSIS — G8929 Other chronic pain: Secondary | ICD-10-CM

## 2023-11-17 DIAGNOSIS — M545 Low back pain, unspecified: Secondary | ICD-10-CM

## 2023-11-23 ENCOUNTER — Encounter: Payer: Self-pay | Admitting: Orthopaedic Surgery

## 2023-11-27 ENCOUNTER — Ambulatory Visit
Admission: RE | Admit: 2023-11-27 | Discharge: 2023-11-27 | Disposition: A | Discharge status: Home / Self Care | Source: Ambulatory Visit | Attending: Orthopaedic Surgery | Admitting: Orthopaedic Surgery

## 2023-11-27 DIAGNOSIS — M545 Low back pain, unspecified: Secondary | ICD-10-CM

## 2024-01-23 ENCOUNTER — Encounter: Payer: Self-pay | Admitting: Radiology

## 2024-03-16 ENCOUNTER — Other Ambulatory Visit: Payer: Self-pay | Admitting: Orthopedic Surgery

## 2024-03-16 ENCOUNTER — Telehealth: Payer: Self-pay

## 2024-03-16 DIAGNOSIS — M545 Low back pain, unspecified: Secondary | ICD-10-CM

## 2024-03-16 NOTE — Telephone Encounter (Signed)
 Called pt to screen for Intradiscal procedure. Left message

## 2024-04-03 NOTE — Discharge Instructions (Signed)
Intra-Discal Anesthetic Injection Discharge Instruction Sheet  You may resume a regular diet and any medications that you routinely take (including pain medications).  No driving day of procedure.  Light activity throughout the rest of the day.  Do not do any strenuous work, exercise, bending or lifting.  The day following the procedure, you can resume normal physical activity but you should refrain from exercising or physical therapy for at least three days thereafter.   Please contact our office at [phone] for the following symptoms: Fever greater than 100 degrees. Headaches unresolved with medication after 2-3 days. 

## 2024-04-05 ENCOUNTER — Inpatient Hospital Stay
Admission: RE | Admit: 2024-04-05 | Discharge: 2024-04-05 | Attending: Orthopedic Surgery | Admitting: Orthopedic Surgery

## 2024-04-05 DIAGNOSIS — M545 Low back pain, unspecified: Secondary | ICD-10-CM

## 2024-04-05 MED ORDER — CEFAZOLIN SODIUM-DEXTROSE 2-4 GM/100ML-% IV SOLN
2.0000 g | INTRAVENOUS | Status: AC
  Administered 2024-04-05: 2 g via INTRAVENOUS

## 2024-04-05 MED ORDER — IOPAMIDOL (ISOVUE-M 200) INJECTION 41%
10.0000 mL | Freq: Once | INTRAMUSCULAR | Status: AC
  Administered 2024-04-05: 10 mL

## 2024-04-05 MED ORDER — METHYLPREDNISOLONE ACETATE 40 MG/ML INJ SUSP (RADIOLOG
80.0000 mg | Freq: Once | INTRAMUSCULAR | Status: AC
  Administered 2024-04-05: 80 mg via INTRALESIONAL

## 2024-04-05 MED ORDER — ONDANSETRON HCL 4 MG/2ML IJ SOLN: 4.0000 mg | Freq: Once | INTRAMUSCULAR | Status: DC
